# Patient Record
Sex: Female | Born: 1955 | Race: White | Hispanic: No | Marital: Married | Smoking: Never smoker
Health system: Southern US, Community
[De-identification: ages and names within clinical notes are randomized; demographics above are authoritative.]

## PROBLEM LIST (undated history)

## (undated) DIAGNOSIS — R7303 Prediabetes: Secondary | ICD-10-CM

## (undated) DIAGNOSIS — J45909 Unspecified asthma, uncomplicated: Secondary | ICD-10-CM

## (undated) DIAGNOSIS — E119 Type 2 diabetes mellitus without complications: Secondary | ICD-10-CM

## (undated) DIAGNOSIS — F419 Anxiety disorder, unspecified: Secondary | ICD-10-CM

## (undated) DIAGNOSIS — E039 Hypothyroidism, unspecified: Secondary | ICD-10-CM

## (undated) DIAGNOSIS — J189 Pneumonia, unspecified organism: Secondary | ICD-10-CM

## (undated) HISTORY — DX: Type 2 diabetes mellitus without complications: E11.9

## (undated) HISTORY — DX: Hypothyroidism, unspecified: E03.9

## (undated) HISTORY — DX: Unspecified asthma, uncomplicated: J45.909

## (undated) HISTORY — PX: KNEE ARTHROSCOPY: SUR90

---

## 1997-12-10 ENCOUNTER — Other Ambulatory Visit: Admission: RE | Admit: 1997-12-10 | Discharge: 1997-12-10 | Payer: Self-pay | Admitting: Obstetrics and Gynecology

## 2001-08-31 HISTORY — PX: MASS EXCISION: SHX2000

## 2001-09-10 ENCOUNTER — Encounter (INDEPENDENT_AMBULATORY_CARE_PROVIDER_SITE_OTHER): Payer: Self-pay | Admitting: Specialist

## 2001-09-10 ENCOUNTER — Ambulatory Visit (HOSPITAL_BASED_OUTPATIENT_CLINIC_OR_DEPARTMENT_OTHER): Admission: RE | Admit: 2001-09-10 | Discharge: 2001-09-10 | Payer: Self-pay | Admitting: Plastic Surgery

## 2001-12-30 ENCOUNTER — Other Ambulatory Visit: Admission: RE | Admit: 2001-12-30 | Discharge: 2001-12-30 | Payer: Self-pay | Admitting: Obstetrics and Gynecology

## 2003-08-18 ENCOUNTER — Other Ambulatory Visit: Admission: RE | Admit: 2003-08-18 | Discharge: 2003-08-18 | Payer: Self-pay | Admitting: Obstetrics and Gynecology

## 2004-09-05 ENCOUNTER — Other Ambulatory Visit: Admission: RE | Admit: 2004-09-05 | Discharge: 2004-09-05 | Payer: Self-pay | Admitting: Obstetrics and Gynecology

## 2007-05-31 ENCOUNTER — Encounter: Admission: RE | Admit: 2007-05-31 | Discharge: 2007-05-31 | Payer: Self-pay | Admitting: Obstetrics and Gynecology

## 2007-12-26 ENCOUNTER — Encounter: Admission: RE | Admit: 2007-12-26 | Discharge: 2007-12-26 | Payer: Self-pay | Admitting: Otolaryngology

## 2008-04-20 ENCOUNTER — Encounter: Admission: RE | Admit: 2008-04-20 | Discharge: 2008-04-20 | Payer: Self-pay | Admitting: Family Medicine

## 2008-10-05 ENCOUNTER — Emergency Department (HOSPITAL_COMMUNITY): Admission: EM | Admit: 2008-10-05 | Discharge: 2008-10-05 | Payer: Self-pay | Admitting: Emergency Medicine

## 2010-08-21 ENCOUNTER — Encounter: Payer: Self-pay | Admitting: Obstetrics and Gynecology

## 2010-08-21 ENCOUNTER — Encounter: Payer: Self-pay | Admitting: Family Medicine

## 2010-11-10 LAB — CBC
Hemoglobin: 14.8 g/dL (ref 12.0–15.0)
MCHC: 33.7 g/dL (ref 30.0–36.0)
MCV: 89 fL (ref 78.0–100.0)
RBC: 4.93 MIL/uL (ref 3.87–5.11)
RDW: 13 % (ref 11.5–15.5)

## 2010-11-10 LAB — BASIC METABOLIC PANEL
BUN: 10 mg/dL (ref 6–23)
Chloride: 107 mEq/L (ref 96–112)
Creatinine, Ser: 0.67 mg/dL (ref 0.4–1.2)
Potassium: 3.6 mEq/L (ref 3.5–5.1)
Sodium: 140 mEq/L (ref 135–145)

## 2010-11-10 LAB — DIFFERENTIAL
Basophils Relative: 1 % (ref 0–1)
Eosinophils Absolute: 0.3 10*3/uL (ref 0.0–0.7)
Lymphs Abs: 1.7 10*3/uL (ref 0.7–4.0)
Monocytes Absolute: 0.6 10*3/uL (ref 0.1–1.0)
Neutro Abs: 4.2 10*3/uL (ref 1.7–7.7)
Neutrophils Relative %: 61 % (ref 43–77)

## 2010-11-10 LAB — POCT CARDIAC MARKERS
CKMB, poc: <1 ng/mL — ABNORMAL LOW (ref 1.0–8.0)
Myoglobin, poc: 45 ng/mL (ref 12–200)
Myoglobin, poc: 62.3 ng/mL (ref 12–200)
Troponin i, poc: <0.05 ng/mL (ref 0.00–0.09)

## 2010-12-16 NOTE — Op Note (Signed)
[place]  Patient:    CRYSTEL, DEMARCO Visit Number: [id number] MRN: [id number]          Service Type: DSU Location: [facility] Attending Physician:  Eloise Levels Dictated by:   Mary A. Contogiannis, M.D. Proc. Date: 09/10/01 Admit Date:  09/10/2001 Discharge Date: 09/10/2001                             Operative Report  PREOPERATIVE DIAGNOSES: 1. Left forehead mass. 2. Right scalp suspicious skin lesion.  POSTOPERATIVE DIAGNOSES: 1. Left forehead mass. 2. Right scalp suspicious skin lesion.  PROCEDURES: 1. Excision of 2.3 cm left forehead mass. 2. Complex closure of 2.0 cm left forehead incision. 3. Excision of 1.1 cm right scalp mass. 4. Intermediate closure of 2.0 cm right scalp incision.  SURGEON:  Mary A. Contogiannis, M.D.  ANESTHESIA:  1% lidocaine with epinephrine.  COMPLICATIONS:  None.  INDICATION FOR PROCEDURE:  The patient is a 55 year old Caucasian female who has a left forehead mass.  This mass has been present almost since birth; however, lately it has been growing and becoming clinically symptomatic for the patient.  She also has a skin lesion in the right scalp that has been undergoing recent changes.  It is getting larger, becoming more raised, and changing color.  She requests that we proceed with excision of both the left forehead mass and the right scalp suspicious skin lesion.  DESCRIPTION OF PROCEDURE:  The patient was brought into the OR and placed on the table in supine position.  The left face was prepped with Betadine and draped in a sterile fashion.  The skin and subcutaneous tissues in the area of the mass were then injected with 1% lidocaine with epinephrine.  After adequate hemostasis and anesthesia had taken effect, the procedure was begun. An area of a left forehead wrinkle was marked directly over the skin mass. The incision was made in this wrinkle.  The incision was carried through the skin  into the subcutaneous tissues with a knife.   From this point, dissection was performed using the scissors.  It was evident that the mass was present below the frontalis muscle.  A small incision was made in the frontalis muscle to allow access to the mass.  The mass was cyst-like and was then sharply excised.  It was adherent to the periosteum.  The mass was then passed off the table to undergo permanent pathologic section evaluation.  The wound was then closed in complex fashion.  The frontalis muscle was closed with 4-0 Monocryl suture.  The deeper subcutaneous tissue and the dermal layer were closed both with 4-0 Monocryl suture as well.  The skin edges were then closed with 6-0 Prolene in a running baseball-type stitch.  The incision was dressed with bacitracin ointment and then a light pressure dressing.  Attention was then turned to the right scalp mass.  The area was prepped with Betadine and draped in sterile fashion.  The skin and subcutaneous tissues in the area of the skin lesion were injected with 1% lidocaine with epinephrine.  After adequate hemostasis and anesthesia had taken effect, the procedure was begun.  The skin lesion was excised with 1 mm of normal skin around it for margins.  The specimen was then marked at the 12 oclock position and passed off the table to undergo permanent pathologic section evaluation.  The skin edges were then undermined for closure.  The deeper subcutaneous tissues and galeal layers were closed with 4-0 Monocryl suture.  The dermal layer was then closed with 4-0 Monocryl suture.  The skin was then closed with a 4-0 Prolene running baseball-type stitch.  The incision was dressed with bacitracin ointment. There were no complications.  The patient tolerated the procedure well.  She was then ambulated and discharged home in stable condition.  The patient was given proper wound care instructions prior to discharge.  Follow-up appointment will be  tomorrow in the office. Dictated by:   Mary A. Contogiannis, M.D. Attending Physician:  Eloise Levels DD:  09/11/01 TD:  09/11/01 Job: [id number]

## 2012-02-21 ENCOUNTER — Encounter: Payer: Self-pay | Admitting: Gastroenterology

## 2012-03-14 ENCOUNTER — Encounter: Payer: Self-pay | Admitting: *Deleted

## 2012-03-21 ENCOUNTER — Encounter: Payer: Self-pay | Admitting: Gastroenterology

## 2012-03-21 ENCOUNTER — Ambulatory Visit (INDEPENDENT_AMBULATORY_CARE_PROVIDER_SITE_OTHER): Payer: BC Managed Care – PPO | Admitting: Gastroenterology

## 2012-03-21 VITALS — BP 100/60 | HR 76 | Ht 66.25 in | Wt 145.5 lb

## 2012-03-21 DIAGNOSIS — J45909 Unspecified asthma, uncomplicated: Secondary | ICD-10-CM

## 2012-03-21 DIAGNOSIS — Z8371 Family history of colonic polyps: Secondary | ICD-10-CM

## 2012-03-21 DIAGNOSIS — R1310 Dysphagia, unspecified: Secondary | ICD-10-CM

## 2012-03-21 DIAGNOSIS — R1314 Dysphagia, pharyngoesophageal phase: Secondary | ICD-10-CM

## 2012-03-21 NOTE — Progress Notes (Signed)
History of Present Illness:  This is a healthy 56 year old Caucasian female with progressive solid food dysphagia in her son xiphoid area and currently to the point where she has difficulty swallowing daily. She initially noticed some intermittent dysphagia in the late 1990s. She denies chronic reflux symptoms, problems swallowing liquids, or any history of severe allergies. The patient does complain of some postnasal drip. She is self-imposed a gluten-free diet because of abdominal bloating. Apparently there is a family history of colon polyps, but she has not had colonoscopy because of fears of hypoglycemia. There is no history of anorexia, weight loss, hepatobiliary or lower GI issues at this time. Apparently she is undergoing menopause but denies gynecologic problems chronically.  Patient does have a history of chronic asthma with intermittent inhaler use.   I have reviewed this patient's present history, medical and surgical past history, allergies and medications. ROS: The remainder of the 10 point ROS is negative     Physical Exam: Blood pressure 100/60, pulse 76 and regular, weight 145 pounds with a BMI of 23.31. General well developed well nourished patient in no acute distress, appearing their stated age Eyes PERRLA, no icterus, fundoscopic exam per opthamologist Skin no lesions noted Neck supple, no adenopathy, no thyroid enlargement, no tenderness Chest clear to percussion and auscultation Heart no significant murmurs, gallops or rubs noted Abdomen no hepatosplenomegaly masses or tenderness, BS normal.  Extremities no acute joint lesions, edema, phlebitis or evidence of cellulitis. Neurologic patient oriented x 3, cranial nerves intact, no focal neurologic deficits noted. Psychological mental status normal and normal affect.  Assessment and plan: Probable Schatzki's ring in the distal esophagus with associated solid food dysphagia. Other considerations would be eosinophilic  esophagitis or silent GERD. I've scheduled her for endoscopy and probable esophageal dilatation. We will obtain biopsies of her esophagus and also duodenal biopsies for celiac disease. We will need to work with this patient about scheduling screening colonoscopy in the future per age and family history of colon polyps.  No diagnosis found.

## 2012-03-21 NOTE — Patient Instructions (Addendum)
You have been scheduled for an endoscopy with propofol. Please follow written instructions given to you at your visit today. If you use inhalers (even only as needed), please bring them with you on the day of your procedure. CC: Dr. Elie Goody

## 2012-03-22 ENCOUNTER — Encounter: Payer: Self-pay | Admitting: Gastroenterology

## 2012-04-08 ENCOUNTER — Encounter: Payer: BC Managed Care – PPO | Admitting: Gastroenterology

## 2013-11-12 ENCOUNTER — Telehealth: Payer: Self-pay | Admitting: Internal Medicine

## 2013-11-12 ENCOUNTER — Ambulatory Visit: Payer: BC Managed Care – PPO | Admitting: Internal Medicine

## 2013-11-12 NOTE — Telephone Encounter (Signed)
No charge. 

## 2014-09-16 ENCOUNTER — Encounter (HOSPITAL_COMMUNITY): Payer: Self-pay | Admitting: Family Medicine

## 2014-09-16 ENCOUNTER — Emergency Department (HOSPITAL_COMMUNITY): Payer: BLUE CROSS/BLUE SHIELD

## 2014-09-16 ENCOUNTER — Observation Stay (HOSPITAL_COMMUNITY)
Admission: EM | Admit: 2014-09-16 | Discharge: 2014-09-17 | Disposition: A | Discharge status: Home / Self Care | Payer: BLUE CROSS/BLUE SHIELD | Attending: Internal Medicine | Admitting: Internal Medicine

## 2014-09-16 DIAGNOSIS — R079 Chest pain, unspecified: Secondary | ICD-10-CM | POA: Diagnosis not present

## 2014-09-16 DIAGNOSIS — Z79899 Other long term (current) drug therapy: Secondary | ICD-10-CM | POA: Insufficient documentation

## 2014-09-16 DIAGNOSIS — E119 Type 2 diabetes mellitus without complications: Secondary | ICD-10-CM | POA: Diagnosis not present

## 2014-09-16 DIAGNOSIS — J45909 Unspecified asthma, uncomplicated: Secondary | ICD-10-CM | POA: Diagnosis not present

## 2014-09-16 DIAGNOSIS — E039 Hypothyroidism, unspecified: Secondary | ICD-10-CM | POA: Insufficient documentation

## 2014-09-16 DIAGNOSIS — R072 Precordial pain: Secondary | ICD-10-CM

## 2014-09-16 DIAGNOSIS — Z8701 Personal history of pneumonia (recurrent): Secondary | ICD-10-CM | POA: Diagnosis not present

## 2014-09-16 DIAGNOSIS — E876 Hypokalemia: Secondary | ICD-10-CM | POA: Diagnosis not present

## 2014-09-16 HISTORY — DX: Pneumonia, unspecified organism: J18.9

## 2014-09-16 LAB — GLUCOSE, CAPILLARY
Glucose-Capillary: 92 mg/dL (ref 70–99)
Glucose-Capillary: 139 mg/dL — ABNORMAL HIGH (ref 70–99)

## 2014-09-16 LAB — I-STAT TROPONIN, ED: Troponin i, poc: 0 ng/mL (ref 0.00–0.08)

## 2014-09-16 LAB — BASIC METABOLIC PANEL
ANION GAP: 5 (ref 5–15)
ANION GAP: 8 (ref 5–15)
BUN: 13 mg/dL (ref 6–23)
BUN: 15 mg/dL (ref 6–23)
CALCIUM: 9.4 mg/dL (ref 8.4–10.5)
CHLORIDE: 112 mmol/L (ref 96–112)
CO2: 22 mmol/L (ref 19–32)
CO2: 21 mmol/L (ref 19–32)
Calcium: 9.1 mg/dL (ref 8.4–10.5)
Chloride: 108 mmol/L (ref 96–112)
Creatinine, Ser: 0.68 mg/dL (ref 0.50–1.10)
Creatinine, Ser: 0.66 mg/dL (ref 0.50–1.10)
GFR calc Af Amer: >90 mL/min (ref >90)
GFR calc Af Amer: >90 mL/min (ref >90)
GLUCOSE: 145 mg/dL — AB (ref 70–99)
Glucose, Bld: 93 mg/dL (ref 70–99)
Potassium: 4.2 mmol/L (ref 3.5–5.1)
Potassium: 3.3 mmol/L — ABNORMAL LOW (ref 3.5–5.1)
SODIUM: 137 mmol/L (ref 135–145)
Sodium: 139 mmol/L (ref 135–145)

## 2014-09-16 LAB — CBC
HCT: 39.2 % (ref 36.0–46.0)
Hemoglobin: 14.3 g/dL (ref 12.0–15.0)
MCH: 29.7 pg (ref 26.0–34.0)
MCHC: 36.5 g/dL — AB (ref 30.0–36.0)
MCV: 81.5 fL (ref 78.0–100.0)
Platelets: 266 10*3/uL (ref 150–400)
RBC: 4.81 MIL/uL (ref 3.87–5.11)
RDW: 13.3 % (ref 11.5–15.5)
WBC: 5.2 10*3/uL (ref 4.0–10.5)

## 2014-09-16 LAB — TSH: TSH: 1.186 u[IU]/mL (ref 0.350–4.500)

## 2014-09-16 LAB — CBG MONITORING, ED: GLUCOSE-CAPILLARY: 158 mg/dL — AB (ref 70–99)

## 2014-09-16 LAB — TROPONIN I
Troponin I: <0.03 ng/mL (ref <0.031)
Troponin I: <0.03 ng/mL (ref <0.031)

## 2014-09-16 MED ORDER — POTASSIUM CHLORIDE CRYS ER 20 MEQ PO TBCR
40.0000 meq | EXTENDED_RELEASE_TABLET | Freq: Once | ORAL | Status: AC
  Administered 2014-09-16: 40 meq via ORAL
  Filled 2014-09-16: qty 2

## 2014-09-16 MED ORDER — ACETAMINOPHEN 325 MG PO TABS: 650.0000 mg | ORAL_TABLET | ORAL | Status: DC | PRN

## 2014-09-16 MED ORDER — ENOXAPARIN SODIUM 40 MG/0.4ML ~~LOC~~ SOLN
40.0000 mg | SUBCUTANEOUS | Status: DC
  Filled 2014-09-16: qty 0.4

## 2014-09-16 MED ORDER — ASPIRIN 81 MG PO CHEW
81.0000 mg | CHEWABLE_TABLET | Freq: Once | ORAL | Status: AC
  Administered 2014-09-16: 81 mg via ORAL
  Filled 2014-09-16: qty 1

## 2014-09-16 MED ORDER — ALBUTEROL SULFATE (2.5 MG/3ML) 0.083% IN NEBU: 3.0000 mL | INHALATION_SOLUTION | Freq: Four times a day (QID) | RESPIRATORY_TRACT | Status: DC | PRN

## 2014-09-16 MED ORDER — THYROID 60 MG PO TABS
90.0000 mg | ORAL_TABLET | Freq: Every day | ORAL | Status: DC
  Filled 2014-09-16 (×2): qty 1

## 2014-09-16 MED ORDER — ONDANSETRON HCL 4 MG/2ML IJ SOLN
4.0000 mg | Freq: Four times a day (QID) | INTRAMUSCULAR | Status: DC | PRN
Start: 2014-09-16 — End: 2014-09-17

## 2014-09-16 MED ORDER — SODIUM CHLORIDE 0.9 % IV BOLUS (SEPSIS)
1000.0000 mL | Freq: Once | INTRAVENOUS | Status: AC
  Administered 2014-09-16: 1000 mL via INTRAVENOUS

## 2014-09-16 NOTE — H&P (Signed)
Date: 09/16/2014               Patient Name:  Kathy Griffin MRN: [id number]  DOB: 1956-01-28 Age / Sex: 59 y.o., female   PCP: Carroll Kinds, NP         Medical Service: Internal Medicine Teaching Service         Attending Physician: Dr. Madilyn Fireman, MD    First Contact: Dr. Ethelene Hal Pager: [phone]  Second Contact: Dr. Redmond Pulling Pager: [phone]       After Hours (After 5p/  First Contact Pager: [phone]  weekends / holidays): Second Contact Pager: [phone]   Chief Complaint: chest pain  History of Present Illness: 59yo F w/ PMH DM2 and hypothyroidism present with an episode of diaphoresis and chest pain while driving.  Pt was driving when she began to experience slow onset of sharp, substernal non-radiating chest pressure with numbness in bilateral hands and around her lips with lightheadedness but no vision changes. She then began to experience diaphoresis with heart palpitations and SOB. This resolved after about 15-20 minutes. She attributes this to stress and said she has had several previous episodes in the past when stressed but without the sequelae. 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 She has never had this before with exertion. She said EMS was called and provided the pt with 325mg  ASA. She did have a similar episode 2 years ago and was found to be hypoglycemic. Pt denies tobacco or h/o drug abuse; she is a social drinker. She has no personal h/o cardiac disease, but does have a family h/o cardiac disease in her father who had a CABG in his 106s. She has been told she has borderline DM2 managed with diet. She was unsure if she was willing to be admitted as she wants an out-patient stress but decided with her husband to stay.  Meds: No current facility-administered medications for this encounter.   Current Outpatient Prescriptions  Medication Sig Dispense Refill  . Ascorbic Acid (VITAMIN C) 1000 MG tablet Take 1,000 mg by mouth daily.      Marland Kitchen b complex vitamins capsule Take 1 capsule by mouth daily.    . Multiple Vitamin (MULTIVITAMIN) capsule Take 1 capsule by mouth daily.    . Omega-3 Fatty Acids (FISH OIL) 1000 MG CAPS Take 1 capsule by mouth daily.    Marland Kitchen thyroid (ARMOUR) 90 MG tablet Take 90 mg by mouth daily.    Marland Kitchen albuterol (PROVENTIL HFA;VENTOLIN HFA) 108 (90 BASE) MCG/ACT inhaler Inhale 2 puffs into the lungs every 6 (six) hours as needed for wheezing or shortness of breath.       Allergies: Allergies as of 09/16/2014  . (No Known Allergies)   Past Medical History  Diagnosis Date  . Asthma   . Hypothyroidism   . DM (diabetes mellitus)   . History of pneumonia    Past Surgical History  Procedure Laterality Date  . Excision of left  forehead mass  08-2001  . Excision right scalp mass  08-2001  . Knee arthroscopy      left   Family History  Problem Relation Age of Onset  . Colon polyps Father   . Heart disease Father   . Diabetes Mother     hypoglycemia   History   Social History  . Marital Status: Married    Spouse Name: N/A  . Number of Children: 4  . Years of Education: N/A   Occupational History  .  homemaker    Social History Main Topics  . Smoking status: Never Smoker   . Smokeless tobacco: Never Used  . Alcohol Use: Yes     Comment: 3-4 per week  . Drug Use: No  . Sexual Activity: Not on file   Other Topics Concern  . Not on file   Social History Narrative    Review of Systems: A comprehensive review of systems was negative except for: as noted above per HPI  Physical Exam: Blood pressure 111/79, pulse 68, temperature 98.3 F (36.8 C), temperature source Oral, resp. rate 19, height 5\' 7"  (1.702 m), weight 137 lb (62.143 kg), last menstrual period 09/16/2013, SpO2 100 %.  Gen: A&O x 4, no acute distress, well developed, well nourished HEENT: Atraumatic, PERRL, EOMI, sclerae anicteric, moist mucous membranes Neck: No carotid bruits or JVD Heart: Regular rate and rhythm, normal S1  S2, no murmurs, rubs, or gallops Lungs: Clear to auscultation bilaterally, respirations unlabored Abd: Soft, non-tender, non-distended, + bowel sounds, no hepatosplenomegaly Ext: No edema or cyanosis  Lab results: Basic Metabolic Panel:  Recent Labs  09/16/14 1125  NA 137  K 3.3*  CL 108  CO2 21  GLUCOSE 145*  BUN 15  CREATININE 0.66  CALCIUM 9.4   CBC:  Recent Labs  09/16/14 1125  WBC 5.2  HGB 14.3  HCT 39.2  MCV 81.5  PLT 266   Cardiac Enzymes: 09/16/14 i-stat troponin 0.00  CBG:  Recent Labs  09/16/14 1111  GLUCAP 158*   Hemoglobin A1C: Pending  Fasting Lipid Panel: Pending  Thyroid Function Tests: Pending  Imaging results:  Dg Chest 2 View  09/16/2014   CLINICAL DATA:  Chest pain and dizziness  EXAM: CHEST  2 VIEW  COMPARISON:  None.  FINDINGS: The heart size and mediastinal contours are within normal limits. Both lungs are clear. The visualized skeletal structures are unremarkable.  IMPRESSION: No active cardiopulmonary disease.   Electronically Signed   By: Inez Catalina M.D.   On: 09/16/2014 13:14    Other results: EKG: NSR, no t-wave inversions or ST elevation or depression, RSR' in V2 but no prior  Assessment & Plan by Problem: 59yo F w/ PMH DM2 and hypothyroidism present with an episode of diaphoresis and chest pain while driving.  #Chest pain: Pt with acute onset sharp, substernal chest pain that began while driving today. Also with diaphoresis, palpitations, and SOB. Similar previous episodes when stressed as she was now as well as  2 years ago and was found to be hypoglycemic. CBG 185 in the ED. Pt with h/o hypothyroidism and is on thyroid supplementation; possible sx related to thyroid dysfunction. No recent TSH. EKG unremarkable except for RSR' in V2 without prior, troponin negative x1. TIMI score 0 which is a 5 percent risk at 14 days of all cause mortality, new or recurrent MI, or severe recurrent ischemia requiring urgent  revascularization. However with her h/o diabetes and given her age, cannot r/o ACS. t - Admit to IMTS to obs tele - cards consult, consider out-patient stress if test negative - Troponin q6 x3 - EKG in AM - Checking TSH - Checking A1c, lipid panel - ASA 81 mg daily - cardiac monitoring - continuous pulse ox  #Diabetes mellitus: No hemoglobin A1c in system. She says it is diet controlled. CBG in ED 185. -check hemoglobin A1c -hold on SSI pending CBG check  #Hypothyroidism: No TSH on file. She takes thyroid armour 90 mg daily -check TSH -cont thyroid armour  90 mg daily  #Hypokalemia: K 3.3 on admission.  - Replacing with 19mEq x1. - BMP in AM  #Diet: heart  #DVT PPx: lovenox 40 mg [place] daily  #Code: full  Dispo: Disposition is deferred at this time, awaiting improvement of current medical problems. Anticipated discharge in approximately 1-2 day(s).   The patient does have a current PCP (Carroll Kinds, NP) and does not need an [facility] hospital follow-up appointment after discharge.  The patient does not have transportation limitations that hinder transportation to clinic appointments.  Signed: [name], MD 09/16/2014, 3:57 PM

## 2014-09-16 NOTE — ED Notes (Signed)
[place] PA aware that patient is declining CXR at this time.

## 2014-09-16 NOTE — ED Notes (Signed)
Pt presents via [facility] with c/o chest pressure that began at 1005 while driving. PT reports her hands also cramped up and felt "numb" and she became flush/pale. Pt given 325mg  ASA PTA.  Pt is pain free at this time.

## 2014-09-16 NOTE — Consult Note (Signed)
CARDIOLOGY CONSULT NOTE  Referring Physician: Ethelene Hal Primary Physician: Primary Cardiologist: None Reason for Consultation:    HPI: 59yo F w/ PMH DM2 and hypothyroidism whom we are asked to see for CP.   She denies any h/o known heart disease. Very active at baseline without anginal symptoms. Several years ago had episode of CP which was attributed to hypoglycemia.  Has been under a lot of stress lately. Over past couple weeks has had a few episodes of chest pain. Says one was severe and felt like someone was pressing on her chest. She describes others as just "pangs." Never has CP when she is exercising.   Today she was driving when she began to experience slow onset of sharp, substernal non-radiating chest pressure with numbness in bilateral hands and around her lips with lightheadedness but no vision changes. She then began to experience diaphoresis with heart palpitations and SOB. This resolved after about 15-20 minutes. She said EMS was called and provided the pt with 325mg  ASA.   She was unsure if she was willing to be admitted as she wants an out-patient stress but decided with her husband to stay. ECG and troponin are normal.    Review of Systems:     Cardiac Review of Systems: {Y] = yes [ ]  = no  Chest Pain [   y ]  Resting SOB [  y ] Exertional SOB  [  ]  Orthopnea [  ]   Pedal Edema [   ]    Palpitations Blue.Reese  ] Syncope  [  ]   Presyncope [   ]  General Review of Systems: [Y] = yes [  ]=no Constitional: recent weight change [  ]; anorexia [  ]; fatigue [  ]; nausea [  ]; night sweats [  ]; fever [  ]; or chills [  ];                 Eyes : blurred vision [  ]; diplopia [   ]; vision changes [  ];  Amaurosis fugax[  ]; Resp: cough [  ];  wheezing[  ];  hemoptysis[  ];  PND [  ];  GI:  gallstones[  ], vomiting[  ];  dysphagia[  ]; melena[  ];  hematochezia [  ]; heartburn[  ];   GU: kidney stones [  ]; hematuria[  ];   dysuria [  ];  nocturia[  ]; incontinence [  ];   Skin: rash, swelling[  ];, hair loss[  ];  peripheral edema[  ];  or itching[  ]; Musculosketetal: myalgias[  ];  joint swelling[  ];  joint erythema[  ];  joint pain[  ];  back pain[  ];  Heme/Lymph: bruising[  ];  bleeding[  ];  anemia[  ];  Neuro: TIA[  ];  headaches[  ];  stroke[  ];  vertigo[  ];  seizures[  ];   paresthesias[  ];  difficulty walking[  ];  Psych:depression[  ]; anxiety[y ];  Endocrine: diabetes[  y;  thyroid dysfunction[  ];  Other:  Past Medical History  Diagnosis Date  . Asthma   . Hypothyroidism   . DM (diabetes mellitus)   . History of pneumonia     Medications Prior to Admission  Medication Sig Dispense Refill  . Ascorbic Acid (VITAMIN C) 1000 MG tablet Take 1,000 mg by mouth daily.    Marland Kitchen b complex vitamins capsule Take 1 capsule by mouth daily.    Marland Kitchen  Multiple Vitamin (MULTIVITAMIN) capsule Take 1 capsule by mouth daily.    . Omega-3 Fatty Acids (FISH OIL) 1000 MG CAPS Take 1 capsule by mouth daily.    Marland Kitchen thyroid (ARMOUR) 90 MG tablet Take 90 mg by mouth daily.    Marland Kitchen albuterol (PROVENTIL HFA;VENTOLIN HFA) 108 (90 BASE) MCG/ACT inhaler Inhale 2 puffs into the lungs every 6 (six) hours as needed for wheezing or shortness of breath.        Marland Kitchen aspirin  81 mg Oral Once  . enoxaparin (LOVENOX) injection  40 mg Subcutaneous Q24H  . potassium chloride  40 mEq Oral Once  . [START ON 09/17/2014] thyroid  90 mg Oral Daily    Infusions:    No Known Allergies  History   Social History  . Marital Status: Married    Spouse Name: N/A  . Number of Children: 4  . Years of Education: N/A   Occupational History  . homemaker    Social History Main Topics  . Smoking status: Never Smoker   . Smokeless tobacco: Never Used  . Alcohol Use: Yes     Comment: 3-4 per week  . Drug Use: No  . Sexual Activity: Not on file   Other Topics Concern  . Not on file   Social History Narrative    Family History  Problem Relation Age of Onset  . Colon polyps Father   .  Heart disease Father   . Diabetes Mother     hypoglycemia    PHYSICAL EXAM: Filed Vitals:   09/16/14 1735  BP: 132/75  Pulse: 58  Temp: 98.1 F (36.7 C)  Resp: 17    No intake or output data in the 24 hours ending 09/16/14 1825  General:  Well appearing. No respiratory difficulty HEENT: normal Neck: supple. no JVD. Carotids 2+ bilat; no bruits. No lymphadenopathy or thryomegaly appreciated. Cor: PMI nondisplaced. Regular rate & rhythm. No rubs, gallops or murmurs. Lungs: clear Abdomen: soft, nontender, nondistended. No hepatosplenomegaly. No bruits or masses. Good bowel sounds. Extremities: no cyanosis, clubbing, rash, edema Neuro: alert & oriented x 3, cranial nerves grossly intact. moves all 4 extremities w/o difficulty. Affect pleasant.  ECG: NSR 69 RSR' No ST-T wave abnormalities.    Results for orders placed or performed during the hospital encounter of 09/16/14 (from the past 24 hour(s))  CBG monitoring, ED     Status: Abnormal   Collection Time: 09/16/14 11:11 AM  Result Value Ref Range   Glucose-Capillary 158 (H) 70 - 99 mg/dL  CBC     Status: Abnormal   Collection Time: 09/16/14 11:25 AM  Result Value Ref Range   WBC 5.2 4.0 - 10.5 K/uL   RBC 4.81 3.87 - 5.11 MIL/uL   Hemoglobin 14.3 12.0 - 15.0 g/dL   HCT 39.2 36.0 - 46.0 %   MCV 81.5 78.0 - 100.0 fL   MCH 29.7 26.0 - 34.0 pg   MCHC 36.5 (H) 30.0 - 36.0 g/dL   RDW 13.3 11.5 - 15.5 %   Platelets 266 150 - 400 K/uL  Basic metabolic panel     Status: Abnormal   Collection Time: 09/16/14 11:25 AM  Result Value Ref Range   Sodium 137 135 - 145 mmol/L   Potassium 3.3 (L) 3.5 - 5.1 mmol/L   Chloride 108 96 - 112 mmol/L   CO2 21 19 - 32 mmol/L   Glucose, Bld 145 (H) 70 - 99 mg/dL   BUN 15 6 - 23 mg/dL  Creatinine, Ser 0.66 0.50 - 1.10 mg/dL   Calcium 9.4 8.4 - 10.5 mg/dL   GFR calc non Af Amer >90 >90 mL/min   GFR calc Af Amer >90 >90 mL/min   Anion gap 8 5 - 15  I-stat troponin, ED (not at [facility])      Status: None   Collection Time: 09/16/14 11:44 AM  Result Value Ref Range   Troponin i, poc 0.00 0.00 - 0.08 ng/mL   Comment 3          Glucose, capillary     Status: None   Collection Time: 09/16/14  5:46 PM  Result Value Ref Range   Glucose-Capillary 92 70 - 99 mg/dL   Comment 1 Documented in Char    Comment 2 Repeat Test    Dg Chest 2 View  09/16/2014   CLINICAL DATA:  Chest pain and dizziness  EXAM: CHEST  2 VIEW  COMPARISON:  None.  FINDINGS: The heart size and mediastinal contours are within normal limits. Both lungs are clear. The visualized skeletal structures are unremarkable.  IMPRESSION: No active cardiopulmonary disease.   Electronically Signed   By: Inez Catalina M.D.   On: 09/16/2014 13:14     ASSESSMENT: 1. Chest pain, atypical 2. DM2   PLAN/DISCUSSION:  Suspect CP is related to anxiety. Given DM2 and FHx will proceed with exercise myoview in am. Can have very small snack prior to exercise given episodes of hypoglycemia in past.  Benay Spice 6:52 PM

## 2014-09-16 NOTE — ED Provider Notes (Signed)
CSN: [id number]     Arrival date & time 09/16/14  1041 History   First MD Initiated Contact with Patient 09/16/14 1047     Chief Complaint  Patient presents with  . Chest Pain     (Consider location/radiation/quality/duration/timing/severity/associated sxs/prior Treatment) HPI    59 year old female with history of non-insulin-dependent dependent diabetes, asthma, hypothyroidism who presents for evaluation of chest pain. Patient was brought here via EMS from her car. Patient reports while driving today she developed gradual onset of left-sided chest pain which she described as sharp pressure sensation with associated numbness sensation to both of her hands and around her lips. Patient states she felt lightheadedness without any significant vision changes. She became pale, diaphoretic. She also endorsed some heart palpitation and some mild shortness of breath as well. Report intermittent heart palpitation in the past, and having shortness of breath but unsure if this is related to her asthma. When EMS arrive patient did receive 325 mg of aspirin. At this time she is symptom-free. Patient denies having any fever, double vision, severe headache, trouble with his speech, abnormal coordination, neck stiffness, abdominal pain, back pain, or weakness. She is nonsmoker, and is a social drinker. Does report that her father has history of heart disease. No prior history of PE or stroke. No recent cardiac stress test. Patient reported having one similar incident 2 years ago when she was admitted to the hospital and found to be hypoglycemic. She was concerning for hypoglycemia.  Past Medical History  Diagnosis Date  . Asthma   . Hypothyroidism   . DM (diabetes mellitus)   . History of pneumonia    Past Surgical History  Procedure Laterality Date  . Excision of left  forehead mass  08-2001  . Excision right scalp mass  08-2001  . Knee arthroscopy      left   Family History  Problem Relation Age of  Onset  . Colon polyps Father   . Heart disease Father   . Diabetes Mother     hypoglycemia   History  Substance Use Topics  . Smoking status: Never Smoker   . Smokeless tobacco: Never Used  . Alcohol Use: Yes     Comment: 3-4 per week   OB History    No data available     Review of Systems  All other systems reviewed and are negative.     Allergies  Review of patient's allergies indicates no known allergies.  Home Medications   Prior to Admission medications   Medication Sig Start Date End Date Taking? Authorizing Provider  Ascorbic Acid (VITAMIN C) 1000 MG tablet Take 1,000 mg by mouth daily.   Yes Historical Provider, MD  b complex vitamins capsule Take 1 capsule by mouth daily.   Yes Historical Provider, MD  Multiple Vitamin (MULTIVITAMIN) capsule Take 1 capsule by mouth daily.   Yes Historical Provider, MD  Omega-3 Fatty Acids (FISH OIL) 1000 MG CAPS Take 1 capsule by mouth daily.   Yes Historical Provider, MD  thyroid (ARMOUR) 90 MG tablet Take 90 mg by mouth daily.   Yes Historical Provider, MD  albuterol (PROVENTIL HFA;VENTOLIN HFA) 108 (90 BASE) MCG/ACT inhaler Inhale 2 puffs into the lungs every 6 (six) hours as needed for wheezing or shortness of breath.     Historical Provider, MD   BP 129/75 mmHg  Pulse 65  Temp(Src) 98.1 F (36.7 C) (Oral)  Resp 16  Ht 5\' 7"  (1.702 m)  Wt 137 lb (62.143 kg)  BMI 21.45 kg/m2  SpO2 100%  LMP 09/16/2013 (Approximate) Physical Exam  Constitutional: She is oriented to person, place, and time. She appears well-developed and well-nourished. No distress.  HENT:  Head: Atraumatic.  Mouth/Throat: Oropharynx is clear and moist.  Eyes: Conjunctivae and EOM are normal. Pupils are equal, round, and reactive to light.  Neck: Normal range of motion. Neck supple.  Cardiovascular: Normal rate, regular rhythm and intact distal pulses.   Pulmonary/Chest: Effort normal and breath sounds normal.  Abdominal: Soft. There is no tenderness.   Musculoskeletal: She exhibits no edema.  Neurological: She is alert and oriented to person, place, and time.  Neurologic exam:  Speech clear, pupils equal round reactive to light, extraocular movements intact  Normal peripheral visual fields Cranial nerves III through XII normal including no facial droop Follows commands, moves all extremities x4, normal strength to bilateral upper and lower extremities at all major muscle groups including grip Sensation normal to light touch and pinprick Coordination intact, no limb ataxia, finger-nose-finger normal Rapid alternating movements normal No pronator drift Gait normal   Skin: No rash noted.  Psychiatric: She has a normal mood and affect.  Nursing note and vitals reviewed.   ED Course  Procedures (including critical care time)  12:14 PM  patient here with transient chest discomfort and lightheadedness. Her heart scores 4. She is currently symptom free. She has no focal neuro deficit concerning for stroke. The symptom is not consistence with TIA. She is not hypoglycemic.  1:56 PM Patient is currently symptom free. Workup reassuring, given her risk factors, and no prior cardiac provocative testing patient will be admitted for a cardiac rule out. I have consult with internal medicine resident who agrees to see and admit patient. Care discussed with Dr. Leonides Schanz. Patient agrees with plan.   Labs Review Labs Reviewed  CBC - Abnormal; Notable for the following:    MCHC 36.5 (*)    All other components within normal limits  BASIC METABOLIC PANEL - Abnormal; Notable for the following:    Potassium 3.3 (*)    Glucose, Bld 145 (*)    All other components within normal limits  CBG MONITORING, ED - Abnormal; Notable for the following:    Glucose-Capillary 158 (*)    All other components within normal limits  I-STAT TROPOININ, ED    Imaging Review Dg Chest 2 View  09/16/2014   CLINICAL DATA:  Chest pain and dizziness  EXAM: CHEST  2 VIEW   COMPARISON:  None.  FINDINGS: The heart size and mediastinal contours are within normal limits. Both lungs are clear. The visualized skeletal structures are unremarkable.  IMPRESSION: No active cardiopulmonary disease.   Electronically Signed   By: Inez Catalina M.D.   On: 09/16/2014 13:14     EKG Interpretation   Date/Time:  Wednesday September 16 2014 10:53:20 EST Ventricular Rate:  69 PR Interval:  138 QRS Duration: 94 QT Interval:  412 QTC Calculation: 441 R Axis:   69 Text Interpretation:  Sinus rhythm Probable left atrial enlargement RSR'  in V1 or V2, right VCD or RVH Confirmed by WARD,  DO, KRISTEN ([id number]) on  09/16/2014 11:32:46 AM      MDM   Final diagnoses:  Chest pain, unspecified chest pain type    BP 129/66 mmHg  Pulse 61  Temp(Src) 98.2 F (36.8 C) (Oral)  Resp 17  Ht 5\' 7"  (1.702 m)  Wt 137 lb (62.143 kg)  BMI 21.45 kg/m2  SpO2 100%  LMP 09/16/2013 (  Approximate)  I have reviewed nursing notes and vital signs. I personally reviewed the imaging tests through PACS system  I reviewed available ER/hospitalization records thought the EMR     Domenic Moras, PA-C 09/16/14 [place], DO 09/16/14 1512

## 2014-09-17 ENCOUNTER — Encounter (HOSPITAL_COMMUNITY): Payer: Self-pay | Admitting: General Practice

## 2014-09-17 ENCOUNTER — Other Ambulatory Visit: Payer: Self-pay | Admitting: Cardiology

## 2014-09-17 DIAGNOSIS — R079 Chest pain, unspecified: Secondary | ICD-10-CM

## 2014-09-17 DIAGNOSIS — E039 Hypothyroidism, unspecified: Secondary | ICD-10-CM | POA: Diagnosis not present

## 2014-09-17 DIAGNOSIS — E119 Type 2 diabetes mellitus without complications: Secondary | ICD-10-CM | POA: Diagnosis not present

## 2014-09-17 DIAGNOSIS — J45909 Unspecified asthma, uncomplicated: Secondary | ICD-10-CM | POA: Diagnosis not present

## 2014-09-17 DIAGNOSIS — E1159 Type 2 diabetes mellitus with other circulatory complications: Secondary | ICD-10-CM

## 2014-09-17 LAB — TROPONIN I: Troponin I: <0.03 ng/mL (ref <0.031)

## 2014-09-17 LAB — BASIC METABOLIC PANEL
Anion gap: 4 — ABNORMAL LOW (ref 5–15)
BUN: 9 mg/dL (ref 6–23)
CHLORIDE: 107 mmol/L (ref 96–112)
CO2: 29 mmol/L (ref 19–32)
CREATININE: 0.67 mg/dL (ref 0.50–1.10)
Calcium: 9.3 mg/dL (ref 8.4–10.5)
GFR calc Af Amer: >90 mL/min (ref >90)
GFR calc non Af Amer: >90 mL/min (ref >90)
GLUCOSE: 105 mg/dL — AB (ref 70–99)
Potassium: 4 mmol/L (ref 3.5–5.1)
Sodium: 140 mmol/L (ref 135–145)

## 2014-09-17 LAB — LIPID PANEL
Cholesterol: 190 mg/dL (ref 0–200)
HDL: 58 mg/dL (ref >39)
LDL CALC: 117 mg/dL — AB (ref 0–99)
TRIGLYCERIDES: 73 mg/dL (ref <150)
Total CHOL/HDL Ratio: 3.3 RATIO
VLDL: 15 mg/dL (ref 0–40)

## 2014-09-17 LAB — GLUCOSE, CAPILLARY
GLUCOSE-CAPILLARY: 100 mg/dL — AB (ref 70–99)
Glucose-Capillary: 119 mg/dL — ABNORMAL HIGH (ref 70–99)

## 2014-09-17 MED ORDER — POTASSIUM CHLORIDE CRYS ER 20 MEQ PO TBCR: 40.0000 meq | EXTENDED_RELEASE_TABLET | Freq: Once | ORAL | Status: DC

## 2014-09-17 NOTE — Discharge Summary (Signed)
Name: Kathy Griffin MRN: [id number] DOB: 1956-02-19 59 y.o. PCP: Kathy Kinds, NP  Date of Admission: 09/16/2014 10:41 AM Date of Discharge: 09/17/2014 Attending Physician: No att. providers found  Discharge Diagnosis:  Principal Problem:   Chest pain Active Problems:   Diabetes mellitus   Hypothyroidism  Discharge Medications:   Medication List    TAKE these medications        albuterol 108 (90 BASE) MCG/ACT inhaler  Commonly known as:  PROVENTIL HFA;VENTOLIN HFA  Inhale 2 puffs into the lungs every 6 (six) hours as needed for wheezing or shortness of breath.     b complex vitamins capsule  Take 1 capsule by mouth daily.     Fish Oil 1000 MG Caps  Take 1 capsule by mouth daily.     multivitamin capsule  Take 1 capsule by mouth daily.     thyroid 90 MG tablet  Commonly known as:  ARMOUR  Take 90 mg by mouth daily.     vitamin C 1000 MG tablet  Take 1,000 mg by mouth daily.        Disposition and follow-up:   Ms.Kathy Griffin was discharged from [facility] in Good condition.  At the hospital follow up visit please address:  1.  Possible stress test, any recurrent chest pain, consider statin  2.  Labs / imaging needed at time of follow-up: consider stress test, repeat BMP (low K on presentation)  3.  Pending labs/ test needing follow-up: hemoglobin A1c  Follow-up Appointments: Follow-up Information    Follow up with Nahser, Wonda Cheng, MD On 10/14/2014.   Specialty:  Cardiology   Why:  @ 8:30 am   Contact information:   [address] [place] [address] [phone]       Follow up with Kathy Carl, MD On 09/21/2014.   Specialty:  Pain Medicine   Why:  @ 3 pm   Contact information:   [place] [address] [phone]       Follow up with Nahser, Wonda Cheng, MD On 09/24/2014.   Specialty:  Cardiology   Why:  at 9:45AM  for exercise [facility] information:   [address] [place] [address] [phone]       Discharge Instructions: Discharge Instructions    Diet - low sodium heart healthy    Complete by:  As directed      Increase activity slowly    Complete by:  As directed            Consultations: Treatment Team:  Rounding Lbcardiology, MD  Procedures Performed:  Dg Chest 2 View  09/16/2014   CLINICAL DATA:  Chest pain and dizziness  EXAM: CHEST  2 VIEW  COMPARISON:  None.  FINDINGS: The heart size and mediastinal contours are within normal limits. Both lungs are clear. The visualized skeletal structures are unremarkable.  IMPRESSION: No active cardiopulmonary disease.   Electronically Signed   By: Kathy Griffin M.D.   On: 09/16/2014 13:14   EKG: NSR, no t-wave inversions or ST elevation or depression, RSR' in V2 but no prior  Admission HPI: 59yo F w/ PMH DM2 and hypothyroidism present with an episode of diaphoresis and chest pain while driving.  Pt was driving when she began to experience slow onset of sharp, substernal non-radiating chest pressure with numbness in bilateral hands and around her lips with lightheadedness but no  vision changes. She then began to experience diaphoresis with heart palpitations and SOB. This resolved after about 15-20 minutes. She attributes this to stress and said she has had several previous episodes in the past when stressed but without the sequelae. 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 She has never had this before with exertion. She said EMS was called and provided the pt with 325mg  ASA. She did have a similar episode 2 years ago and was found to be hypoglycemic. Pt denies tobacco or h/o drug abuse; she is a social drinker. She has no personal h/o cardiac disease, but does have a family h/o cardiac disease in her father who had a CABG in his 32s. She has been told she has borderline DM2 managed with diet. She was unsure if she was willing to be admitted as she  wants an out-patient stress but decided with her husband to stay.  Hospital Course by problem list: Principal Problem:   Chest pain Active Problems:   Diabetes mellitus   Hypothyroidism   #Chest pain: Pt with acute onset sharp, substernal chest pain that began while driving 0/25. Also with diaphoresis, palpitations, and SOB. Similar previous episodes when stressed as she was now as well as 2 years ago and was found to be hypoglycemic. Sugars have been normal. EKG unremarkable other than RSR' in V2 without prior and troponin negative x 3. TIMI score 0 which is a 5 percent risk at 14 days of all cause mortality, new or recurrent MI, or severe recurrent ischemia requiring urgent revascularization. TSH wnl. She was seen by cardiology but refused exercise myoview the morning of discharge as she is not concerned this is cardiac but rather stress related and she is concerned about radiation exposure. She said she would consider cardiology follow-up with out-patient stress testing. This may be stress related as she says she gets this when she is stressed. She refused statin and this should be discussed with PCP.  #Diabetes mellitus: No hemoglobin A1c in system. She says it is diet controlled. CBG 119 in am. PCP should follow-up hemoglobin A1c.  #Hypothyroidism: No TSH on file but checked and now 1.186. She takes thyroid armour 90 mg daily which was continued.  #Hypokalemia: K 3.3 on admission. She received KDur 40 mEq and up to 4.0 the morning of discharge.   Discharge Vitals:   BP 101/61 mmHg  Pulse 64  Temp(Src) 98 F (36.7 C) (Oral)  Resp 18  Ht 5\' 7"  (1.702 m)  Wt 140 lb 11.2 oz (63.821 kg)  BMI 22.03 kg/m2  SpO2 98%  LMP 09/16/2013 (Approximate)  Discharge Physical Exam: Gen: A&O x 4, no acute distress, well developed, well nourished HEENT: Atraumatic, PERRL, EOMI, sclerae anicteric, moist mucous membranes Heart: Regular rate and rhythm, normal S1 S2, no murmurs, rubs, or  gallops Lungs: Clear to auscultation bilaterally, respirations unlabored Abd: Soft, non-tender, non-distended, + bowel sounds, no hepatosplenomegaly Ext: No edema or cyanosis  Discharge Labs:  Basic Metabolic Panel:  Recent Labs  09/16/14 1125 09/17/14 0910  NA 137 140  K 3.3* 4.0  CL 108 107  CO2 21 29  GLUCOSE 145* 105*  BUN 15 9  CREATININE 0.66 0.67  CALCIUM 9.4 9.3   CBC:  Recent Labs  09/16/14 1125  WBC 5.2  HGB 14.3  HCT 39.2  MCV 81.5  PLT 266   Cardiac Enzymes:  Recent Labs  09/16/14 2000 09/16/14 2044 09/16/14 2317  TROPONINI <0.03 <0.03 <0.03  CBG:  Recent Labs  09/16/14 1111 09/16/14 1746 09/16/14 2036 09/17/14 0746 09/17/14 1200  GLUCAP 158* 92 139* 119* 100*   Fasting Lipid Panel:  Recent Labs  09/17/14 0910  CHOL 190  HDL 58  LDLCALC 117*  TRIG 73  CHOLHDL 3.3   Thyroid Function Tests:  Recent Labs  09/16/14 2000  TSH 1.186   Signed: [name], MD 09/17/2014, 2:13 PM    Services Ordered on Discharge: none Equipment Ordered on Discharge: none

## 2014-09-17 NOTE — Progress Notes (Signed)
         Subjective: No further pain  Objective: Vital signs in last 24 hours: Temp:  [98 F (36.7 C)-98.5 F (36.9 C)] 98 F (36.7 C) (02/18 0500) Pulse Rate:  [56-71] 64 (02/18 0500) Resp:  [12-23] 18 (02/18 0500) BP: (101-139)/(48-81) 101/61 mmHg (02/18 0500) SpO2:  [98 %-100 %] 98 % (02/18 0500) Weight:  [137 lb (62.143 kg)-140 lb 11.2 oz (63.821 kg)] 140 lb 11.2 oz (63.821 kg) (02/17 1735) Weight change:  Last BM Date: 09/16/14 Intake/Output from previous day:not done   Intake/Output this shift:    PE: General:Pleasant affect, NAD Skin:Warm and dry, brisk capillary refill HEENT:normocephalic, sclera clear, mucus membranes moist Heart:S1S2 RRR without murmur, gallup, rub or click Lungs:clear without rales, rhonchi, or wheezes FHQ:RFXJ, non tender, + BS, do not palpate liver spleen or masses Ext:no lower ext edema, 2+ pedal pulses, 2+ radial pulses Neuro:alert and oriented, MAE, follows commands, + facial symmetry  Tele:  SR no ectopy  Lab Results:  Recent Labs  09/16/14 1125  WBC 5.2  HGB 14.3  HCT 39.2  PLT 266   BMET  Recent Labs  09/16/14 0500 09/16/14 1125  NA 139 137  K 4.2 3.3*  CL 112 108  CO2 22 21  GLUCOSE 93 145*  BUN 13 15  CREATININE 0.68 0.66  CALCIUM 9.1 9.4    Recent Labs  09/16/14 2044 09/16/14 2317  TROPONINI <0.03 <0.03    No results found for: CHOL, HDL, LDLCALC, LDLDIRECT, TRIG, CHOLHDL No results found for: HGBA1C   Lab Results  Component Value Date   TSH 1.186 09/16/2014      Studies/Results: Dg Chest 2 View  09/16/2014   CLINICAL DATA:  Chest pain and dizziness  EXAM: CHEST  2 VIEW  COMPARISON:  None.  FINDINGS: The heart size and mediastinal contours are within normal limits. Both lungs are clear. The visualized skeletal structures are unremarkable.  IMPRESSION: No active cardiopulmonary disease.   Electronically Signed   By: Inez Catalina M.D.   On: 09/16/2014 13:14    Medications: I have reviewed the  patient's current medications. Scheduled Meds: . enoxaparin (LOVENOX) injection  40 mg Subcutaneous Q24H  . thyroid  90 mg Oral Daily   Continuous Infusions:  PRN Meds:.acetaminophen, albuterol, ondansetron (ZOFRAN) IV  Assessment/Plan: Principal Problem:   Chest pain- neg troponin, pt has eaten and does not want stress myoview  test, she never has pain with exertion.  She has no further pain.  She believes her symptoms were related to hypoglycemia and stress, she has been under a lot of stress. ? Discharge and outpt regular stress test- she did not isotope.  Dr. Tamala Julian to see.  Active Problems:   Diabetes mellitus- with episodes of hypoglycemia    Hypothyroidism      Time spent with pt. :15 minutes. [facility] R  Nurse Practitioner Certified Pager [phone] or after 5pm and on weekends call [phone] 09/17/2014, 9:52 AM

## 2014-09-17 NOTE — Progress Notes (Signed)
Subjective: Kathy Griffin. She has not had the chest pain since the episode that brought her to the hospital. She was seen by cardiology who wanted exercise myoview this morning. When she went down she refused the test. I went to speak with her and she said she is not concerned that this is her heart. She was adamant she would not undergo the test as she was concerned about radiation exposure. She was agreeable to me scheduling out-patient follow-up with cardiology and she would consider stress test in the interim  Objective: Vital signs in last 24 hours: Filed Vitals:   09/16/14 1601 09/16/14 1735 09/16/14 2032 09/17/14 0500  BP: 124/72 132/75 111/71 101/61  Pulse:  58 71 64  Temp: 98 F (36.7 C) 98.1 F (36.7 C) 98.5 F (36.9 C) 98 F (36.7 C)  TempSrc: Oral Oral Oral   Resp: 20 17 18 18   Height:  5\' 7"  (1.702 m)    Weight:  140 lb 11.2 oz (63.821 kg)    SpO2: 100% 100% 98% 98%   Weight change:  No intake or output data in the 24 hours ending 09/17/14 1043  Gen: A&O x 4, no acute distress, well developed, well nourished HEENT: Atraumatic, PERRL, EOMI, sclerae anicteric, moist mucous membranes Heart: Regular rate and rhythm, normal S1 S2, no murmurs, rubs, or gallops Lungs: Clear to auscultation bilaterally, respirations unlabored Abd: Soft, non-tender, non-distended, + bowel sounds, no hepatosplenomegaly Ext: No edema or cyanosis  Lab Results: Basic Metabolic Panel:  Recent Labs Lab 09/16/14 1125 09/17/14 0910  NA 137 140  K 3.3* 4.0  CL 108 107  CO2 21 29  GLUCOSE 145* 105*  BUN 15 9  CREATININE 0.66 0.67  CALCIUM 9.4 9.3   CBC:  Recent Labs Lab 09/16/14 1125  WBC 5.2  HGB 14.3  HCT 39.2  MCV 81.5  PLT 266   Cardiac Enzymes:  Recent Labs Lab 09/16/14 2000 09/16/14 2044 09/16/14 2317  TROPONINI <0.03 <0.03 <0.03   CBG:  Recent Labs Lab 09/16/14 1111 09/16/14 1746 09/16/14 2036 09/17/14 0746  GLUCAP 158* 92 139* 119*   Thyroid Function  Tests:  Recent Labs Lab 09/16/14 2000  TSH 1.186   Micro Results: No results found for this or any previous visit (from the past 240 hour(s)). Studies/Results: Dg Chest 2 View  09/16/2014   CLINICAL DATA:  Chest pain and dizziness  EXAM: CHEST  2 VIEW  COMPARISON:  None.  FINDINGS: The heart size and mediastinal contours are within normal limits. Both lungs are clear. The visualized skeletal structures are unremarkable.  IMPRESSION: No active cardiopulmonary disease.   Electronically Signed   By: Inez Catalina M.D.   On: 09/16/2014 13:14   Medications: I have reviewed the patient's current medications. Scheduled Meds: . enoxaparin (LOVENOX) injection  40 mg Subcutaneous Q24H  . thyroid  90 mg Oral Daily   Continuous Infusions:  PRN Meds:.acetaminophen, albuterol, ondansetron (ZOFRAN) IV Assessment/Plan: Principal Problem:   Chest pain Active Problems:   Diabetes mellitus   Hypothyroidism  #Chest pain: Pt with acute onset sharp, substernal chest pain that began while driving today. Also with diaphoresis, palpitations, and SOB. Similar previous episodes when stressed as she was now as well as 2 years ago and was found to be hypoglycemic. Sugars have been normal. EKG unremarkable other than RSR' in V2 without prior and troponin negative x 3. TIMI score 0 which is a 5 percent risk at 14 days of all cause mortality, new or recurrent  MI, or severe recurrent ischemia requiring urgent revascularization. TSH wnl. She was seen by cardiology but refused exercise myoview this morning as she is not concerned this is cardiac but rather stress related and she is concerned about radiation exposure. She said she would consider cardiology follow-up. This may be stress related as she says she gets this when she is stressed. - appreciate cards -outpatient cards f/u as she refuses recommended exercise myoview - cards consult, consider out-patient stress if test negative - f/u hemoglobin A1c - ASA 81 mg  daily - PCP consider statin as she says she would not want one now - cardiac monitoring - continuous pulse ox  #Diabetes mellitus: No hemoglobin A1c in system. She says it is diet controlled. CBG 119 in am. -f/u hemoglobin A1c -hold on SSI   #Hypothyroidism: No TSH on file but checked and now 1.186. She takes thyroid armour 90 mg daily -cont thyroid armour 90 mg daily  #Hypokalemia: K 3.3 on admission. She received KDur 40 mEq and up to 4.0 this morning.  -repeat as out-patient  Dispo: Disposition is deferred at this time, awaiting improvement of current medical problems.  Anticipated discharge in approximately today.   The patient does have a current PCP (Carroll Kinds, NP) and does need an [facility] hospital follow-up appointment after discharge.  The patient does not have transportation limitations that hinder transportation to clinic appointments.  .Services Needed at time of discharge: Y = Yes, Blank = No PT:   OT:   RN:   Equipment:   Other:       Kathy Aline, MD 09/17/2014, 10:43 AM

## 2014-09-17 NOTE — Discharge Instructions (Signed)
It was a pleasure to care for you at [facility]. We have not started any new medicines. Please go to your follow-up appointments with your primary care provider and the cardiologist.Please return to the Emergency Department or seek medical attention if you have any new or worsening chest pain, trouble breathing, or other worrisome medical condition.   Lottie Mussel, MD   Please be nothing to eat or drink 4 hours prior to test.  Have a light breakfast by 5:30   Wear exercise clothes and tennis shoes.  The test is at Dr. Elmarie Shiley office.  If any questions please call.

## 2014-09-17 NOTE — Progress Notes (Signed)
UR completed 

## 2014-09-18 LAB — HEMOGLOBIN A1C
Hgb A1c MFr Bld: 5.7 % — ABNORMAL HIGH (ref 4.8–5.6)
Mean Plasma Glucose: 117 mg/dL

## 2014-09-24 ENCOUNTER — Encounter (HOSPITAL_COMMUNITY): Payer: BLUE CROSS/BLUE SHIELD

## 2014-10-01 ENCOUNTER — Encounter (HOSPITAL_COMMUNITY): Payer: BLUE CROSS/BLUE SHIELD

## 2014-10-12 ENCOUNTER — Telehealth: Payer: Self-pay | Admitting: Cardiovascular Disease

## 2014-10-13 NOTE — Telephone Encounter (Signed)
noted 

## 2014-10-14 ENCOUNTER — Encounter: Payer: BLUE CROSS/BLUE SHIELD | Admitting: Cardiovascular Disease

## 2015-10-12 ENCOUNTER — Encounter (INDEPENDENT_AMBULATORY_CARE_PROVIDER_SITE_OTHER): Payer: Self-pay

## 2015-10-12 ENCOUNTER — Ambulatory Visit (INDEPENDENT_AMBULATORY_CARE_PROVIDER_SITE_OTHER): Payer: BLUE CROSS/BLUE SHIELD | Admitting: Sports Medicine

## 2015-10-12 ENCOUNTER — Encounter: Payer: Self-pay | Admitting: Sports Medicine

## 2015-10-12 VITALS — BP 118/68 | Ht 66.0 in | Wt 145.0 lb

## 2015-10-12 DIAGNOSIS — M1712 Unilateral primary osteoarthritis, left knee: Secondary | ICD-10-CM | POA: Diagnosis not present

## 2015-10-12 NOTE — Progress Notes (Signed)
Patient ID: Kathy Griffin, female   DOB: Sep 29, 1955, 60 y.o.   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 Past - did tennis until 15 yrs ago and gave that up with knee pain/ ant Walks for exercise - and takes care of horses - usually about 1 mile Days of week with activity are 7  Biking Used do to [facility] and road End of May in 2016 left knee swelled after a ride  2 wks ago felt unstable/ swelled and pain all around to bike Wanted to buckle She decided to come for eval  Past history Patellar dislocation on the left requiring arthroscopic surgery Increase mobility of certain joints   X-ray at flexogenic showed some mild arthritis Cortisone shot did not make any difference  Soc Hx Non smoker 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 Small SPP effusion Palpation normal with no warmth or joint line tenderness or patellar tenderness or condyle tenderness. ROM normal in flexion and extension and lower leg rotation. However left knee shows less flexion than RT Ligaments with solid consistent endpoints including ACL, PCL, LCL, MCL. Negative Mcmurray's and provocative meniscal tests. Some crepitation on PF movement Patellar and quadriceps tendons unremarkable. Hamstring  strength is normal.  Quadriceps is a bit weaker on the left with poor VMO development Left hip abduction is weak  Beighton score is 3  Ultrasound Bilateral knees show patella  Alta Left knee shows fragmentation and avulsions at the distal patella insertion to the patellar tendon There is a small suprapatellar pouch effusion only under the vastus lateralis There is some mild spurring along the joint line Some mild degenerative meniscal changes Soft tissue calcifications in the suprapatellar area and. Peri-patellar area

## 2015-10-12 NOTE — Assessment & Plan Note (Signed)
Based on review of her chart I do not think she has a firm diagnosis of diabetes but relatively high levels of glucose at 105 and a glycohemoglobin of 5.7  I suspect she would be an excellent responder to conservative care I suggested entering the osteoarthritis study Focus on home exercise program as directed by physical therapists  Reassess after 3 months to see if this is giving her improvement

## 2016-01-07 ENCOUNTER — Other Ambulatory Visit: Payer: Self-pay | Admitting: Pain Medicine

## 2016-01-07 DIAGNOSIS — N6310 Unspecified lump in the right breast, unspecified quadrant: Secondary | ICD-10-CM

## 2016-01-11 ENCOUNTER — Other Ambulatory Visit: Payer: Self-pay | Admitting: Pain Medicine

## 2016-01-11 DIAGNOSIS — N6310 Unspecified lump in the right breast, unspecified quadrant: Secondary | ICD-10-CM

## 2016-01-18 ENCOUNTER — Other Ambulatory Visit: Payer: BLUE CROSS/BLUE SHIELD

## 2017-02-07 ENCOUNTER — Other Ambulatory Visit: Payer: Self-pay | Admitting: Family Medicine

## 2017-02-07 DIAGNOSIS — N6310 Unspecified lump in the right breast, unspecified quadrant: Secondary | ICD-10-CM

## 2017-02-19 ENCOUNTER — Other Ambulatory Visit: Payer: BLUE CROSS/BLUE SHIELD

## 2018-06-30 DIAGNOSIS — C50919 Malignant neoplasm of unspecified site of unspecified female breast: Secondary | ICD-10-CM

## 2018-06-30 DIAGNOSIS — C801 Malignant (primary) neoplasm, unspecified: Secondary | ICD-10-CM

## 2018-06-30 HISTORY — DX: Malignant (primary) neoplasm, unspecified: C80.1

## 2018-06-30 HISTORY — PX: PORTACATH PLACEMENT: SHX2246

## 2018-06-30 HISTORY — DX: Malignant neoplasm of unspecified site of unspecified female breast: C50.919

## 2018-07-08 ENCOUNTER — Other Ambulatory Visit: Payer: Self-pay | Admitting: Radiology

## 2018-07-09 ENCOUNTER — Telehealth: Payer: Self-pay | Admitting: Hematology and Oncology

## 2018-07-09 NOTE — Telephone Encounter (Signed)
Patient called back to confirm afternoon [facility] appointment

## 2018-07-11 ENCOUNTER — Encounter: Payer: Self-pay | Admitting: *Deleted

## 2018-07-15 ENCOUNTER — Encounter: Payer: Self-pay | Admitting: Licensed Clinical Social Worker

## 2018-07-16 ENCOUNTER — Encounter: Payer: Self-pay | Admitting: *Deleted

## 2018-07-16 ENCOUNTER — Other Ambulatory Visit: Payer: Self-pay | Admitting: *Deleted

## 2018-07-16 DIAGNOSIS — C50412 Malignant neoplasm of upper-outer quadrant of left female breast: Secondary | ICD-10-CM

## 2018-07-16 DIAGNOSIS — Z171 Estrogen receptor negative status [ER-]: Secondary | ICD-10-CM

## 2018-07-16 NOTE — Progress Notes (Signed)
[facility] CONSULT NOTE  Patient Care Team: Carroll Kinds, NP (Inactive) as PCP - General (Nurse Practitioner) Rolm Bookbinder, MD as Consulting Physician (General Surgery) Nicholas Lose, MD as Consulting Physician (Hematology and Oncology) Eppie Gibson, MD as Attending Physician (Radiation Oncology)  CHIEF COMPLAINTS/PURPOSE OF CONSULTATION:  Newly diagnosed breast cancer  HISTORY OF PRESENTING ILLNESS:  Kathy Griffin 62 y.o. female is here because of recent diagnosis of triple negative invasive ductal carcinoma of the left breast. The cancer was palpable to the patient in early October prior to diagnosis. A diagnostic mammogram on 07/02/18 showed a 1.5cm mass at the 11 o'clock position in the left breast. A breast biopsy on 07/08/18 showed an abnormally enlarged lymph node in the left axilla suggestive of metastatic malignancy. The biopsy showed the cancer to be grade 3, Ki67 60%, ER/PR negative, HER2 negative.   She presents to the clinic today with her husband. She has a family history of breast cancer-- her mother was diagnosed with stage 0 breast cancer at age 5. Her husband noted she is taking progesterone and using estradiol creme and expressed worry about her supplements and herbal remedies interacting with treatment. She notes she has a holistic medicine doctor who provides her supplements and she will see him in the near future. She has another appointment to get a second opinion tomorrow. She expressed interest in participating in a cardiac clinical trial.   I reviewed her records extensively and collaborated the history with the patient.  REVIEW OF SYSTEMS:   Constitutional: Denies fevers, chills or abnormal night sweats Eyes: Denies blurriness of vision, double vision or watery eyes Ears, nose, mouth, throat, and face: Denies mucositis or sore throat Respiratory: Denies cough, dyspnea or wheezes Cardiovascular: Denies palpitation, chest discomfort or lower  extremity swelling Gastrointestinal:  Denies nausea, heartburn or change in bowel habits Skin: Denies abnormal skin rashes Lymphatics: Denies new lymphadenopathy or easy bruising Neurological:Denies numbness, tingling or new weaknesses Behavioral/Psych: Mood is stable, no new changes  Breast: Denies any palpable lumps or discharge All other systems were reviewed with the patient and are negative.  SUMMARY OF ONCOLOGIC HISTORY:   Malignant neoplasm of upper-outer quadrant of left breast in female, estrogen receptor negative ([place])   07/16/2018 Initial Diagnosis    Screening detected left breast cancer, 2.1 cm at 12 o'clock position 3 cm from the nipple, 2.8 cm abnormal lymph node, biopsy revealed grade 3 IDC with DCIS high-grade, triple negative, Ki-67 60%, T2N1 stage IIIb clinical stage    07/17/2018 Cancer Staging    Staging form: Breast, AJCC 8th Edition - Clinical: Stage IIIB (cT2, cN1, cM0, G3, ER-, PR-, HER2-) - Signed by Nicholas Lose, MD on 07/17/2018    07/30/2018 -  Chemotherapy    The patient had DOXOrubicin (ADRIAMYCIN) chemo injection 100 mg, 60 mg/m2 = 100 mg, Intravenous,  Once, 0 of 4 cycles palonosetron (ALOXI) injection 0.25 mg, 0.25 mg, Intravenous,  Once, 0 of 8 cycles pegfilgrastim-cbqv (UDENYCA) injection 6 mg, 6 mg, Subcutaneous, Once, 0 of 4 cycles CARBOplatin (PARAPLATIN) in sodium chloride 0.9 % 100 mL chemo infusion, , Intravenous,  Once, 0 of 4 cycles cyclophosphamide (CYTOXAN) 1,000 mg in sodium chloride 0.9 % 250 mL chemo infusion, 600 mg/m2 = 1,000 mg, Intravenous,  Once, 0 of 4 cycles PACLitaxel (TAXOL) 132 mg in sodium chloride 0.9 % 250 mL chemo infusion (</= 26m/m2), 80 mg/m2, Intravenous,  Once, 0 of 4 cycles fosaprepitant (EMEND) 150 mg, dexamethasone (DECADRON) 12 mg in sodium chloride  0.9 % 145 mL IVPB, , Intravenous,  Once, 0 of 8 cycles  for chemotherapy treatment.       MEDICAL HISTORY:  Past Medical History:  Diagnosis Date  . Asthma   .  DM (diabetes mellitus) ([place])   . Hypothyroidism   . Pneumonia     SURGICAL HISTORY: Past Surgical History:  Procedure Laterality Date  . KNEE ARTHROSCOPY Left   . MASS EXCISION Left 08/2001   forehead  . MASS EXCISION Right 08/2001   scalp    SOCIAL HISTORY: Social History   Socioeconomic History  . Marital status: Married    Spouse name: Not on file  . Number of children: 4  . Years of education: Not on file  . Highest education level: Not on file  Occupational History  . Occupation: homemaker  Social Needs  . Financial resource strain: Not on file  . Food insecurity:    Worry: Not on file    Inability: Not on file  . Transportation needs:    Medical: Not on file    Non-medical: Not on file  Tobacco Use  . Smoking status: Never Smoker  . Smokeless tobacco: Never Used  Substance and Sexual Activity  . Alcohol use: Yes    Comment: 3-4 per week  . Drug use: No  . Sexual activity: Not on file  Lifestyle  . Physical activity:    Days per week: Not on file    Minutes per session: Not on file  . Stress: Not on file  Relationships  . Social connections:    Talks on phone: Not on file    Gets together: Not on file    Attends religious service: Not on file    Active member of club or organization: Not on file    Attends meetings of clubs or organizations: Not on file    Relationship status: Not on file  . Intimate partner violence:    Fear of current or ex partner: Not on file    Emotionally abused: Not on file    Physically abused: Not on file    Forced sexual activity: Not on file  Other Topics Concern  . Not on file  Social History Narrative  . Not on file    FAMILY HISTORY: Family History  Problem Relation Age of Onset  . Colon polyps Father   . Heart disease Father   . Diabetes Mother        hypoglycemia    ALLERGIES:  has No Known Allergies.  MEDICATIONS:  Current Outpatient Medications  Medication Sig Dispense Refill  . albuterol (PROVENTIL  HFA;VENTOLIN HFA) 108 (90 BASE) MCG/ACT inhaler Inhale 2 puffs into the lungs every 6 (six) hours as needed for wheezing or shortness of breath.     . Ascorbic Acid (VITAMIN C) 1000 MG tablet Take 1,000 mg by mouth daily.    Marland Kitchen b complex vitamins capsule Take 1 capsule by mouth daily.    Marland Kitchen dexamethasone (DECADRON) 4 MG tablet Take 1 tablet day after chemotherapy and 1 tablet 2 days after chemotherapy with food 8 tablet 0  . fluconazole (DIFLUCAN) 200 MG tablet TAKE 1 TABLET BY MOUTH 2 TIMES WEEKLY  3  . lidocaine-prilocaine (EMLA) cream Apply to affected area once 30 g 3  . LORazepam (ATIVAN) 0.5 MG tablet Take 1 tablet (0.5 mg total) by mouth at bedtime as needed for sleep. 30 tablet 0  . minocycline (MINOCIN,DYNACIN) 100 MG capsule TAKE 2 CAPSULES BY MOUTH TWICE A  DAY EVERY OTHER DAY 1HR BEFORE TINDAMAX  2  . Multiple Vitamin (MULTIVITAMIN) capsule Take 1 capsule by mouth daily.    . Omega-3 Fatty Acids (FISH OIL) 1000 MG CAPS Take 1 capsule by mouth daily.    . ondansetron (ZOFRAN) 8 MG tablet Take 1 tablet (8 mg total) by mouth 2 (two) times daily as needed. Start on the third day after chemotherapy. 30 tablet 1  . prochlorperazine (COMPAZINE) 10 MG tablet Take 1 tablet (10 mg total) by mouth every 6 (six) hours as needed (Nausea or vomiting). 30 tablet 1  . thyroid (ARMOUR) 90 MG tablet Take 90 mg by mouth daily.    Marland Kitchen tinidazole (TINDAMAX) 500 MG tablet TAKE 2 TABLETS BY MOUTH TWICE A DAY EVERY OTHER DAY AFTER MONOCYCLINE  2   No current facility-administered medications for this visit.    PHYSICAL EXAMINATION: ECOG PERFORMANCE STATUS: 0 - Asymptomatic  Vitals:   07/17/18 1320  BP: 122/73  Pulse: 71  Resp: 18  Temp: 98.2 F (36.8 C)  SpO2: 98%   Filed Weights   07/17/18 1320  Weight: 133 lb 6.4 oz (60.5 kg)    GENERAL:alert, no distress and comfortable SKIN: skin color, texture, turgor are normal, no rashes or significant lesions EYES: normal, conjunctiva are pink and  non-injected, sclera clear OROPHARYNX:no exudate, no erythema and lips, buccal mucosa, and tongue normal  NECK: supple, thyroid normal size, non-tender, without nodularity LYMPH:  no palpable lymphadenopathy in the cervical, axillary or inguinal LUNGS: clear to auscultation and percussion with normal breathing effort HEART: regular rate & rhythm and no murmurs and no lower extremity edema ABDOMEN:abdomen soft, non-tender and normal bowel sounds Musculoskeletal:no cyanosis of digits and no clubbing  PSYCH: alert & oriented x 3 with fluent speech NEURO: no focal motor/sensory deficits BREAST: Palpable lump in the left breast and palpable lymph node in the left axilla. No palpable axillary or supraclavicular lymphadenopathy (exam performed in the presence of a chaperone)   LABORATORY DATA:  I have reviewed the data as listed Lab Results  Component Value Date   WBC 5.9 07/17/2018   HGB 14.8 07/17/2018   HCT 44.5 07/17/2018   MCV 87.3 07/17/2018   PLT 236 07/17/2018   Lab Results  Component Value Date   NA 142 07/17/2018   K 4.6 07/17/2018   CL 105 07/17/2018   CO2 29 07/17/2018    RADIOGRAPHIC STUDIES: I have personally reviewed the radiological reports and agreed with the findings in the report.  ASSESSMENT AND PLAN:  Malignant neoplasm of upper-outer quadrant of left breast in female, estrogen receptor negative ([place]) Screening detected left breast cancer, 2.1 cm at 12 o'clock position 3 cm from the nipple, 2.8 cm abnormal lymph node, biopsy revealed grade 3 IDC with DCIS high-grade, triple negative, Ki-67 60%, T2N1 stage IIIb clinical stage  Pathology and radiology counseling: Discussed with the patient, the details of pathology including the type of breast cancer,the clinical staging, the significance of ER, PR and HER-2/neu receptors and the implications for treatment. After reviewing the pathology in detail, we proceeded to discuss the different treatment options between  surgery, radiation, chemotherapy, antiestrogen therapies.  Recommendation: 1.  Neoadjuvant chemotherapy with dose dense Adriamycin and Cytoxan x4 followed by Taxol and carboplatin x12 2.  Breast conserving surgery with targeted lymph node biopsy if possible 3.  Adjuvant radiation  Chemotherapy Counseling: I discussed the risks and benefits of chemotherapy including the risks of nausea/ vomiting, risk of infection from low WBC count,  fatigue due to chemo or anemia, bruising or bleeding due to low platelets, mouth sores, loss/ change in taste and decreased appetite. Liver and kidney function will be monitored through out chemotherapy as abnormalities in liver and kidney function may be a side effect of treatment. Cardiac dysfunction due to Adriamycin was discussed in detail. Risk of permanent bone marrow dysfunction and leukemia due to chemo were also discussed.  Plan: 1.  Breast MRI 2.  Port placement 3.  Genetics consultation 4.  Echocardiogram 5.  Chemo class 6.  Upbeat clinical trial  Return to clinic to begin chemotherapy    All questions were answered. The patient knows to call the clinic with any problems, questions or concerns.    Nicholas Lose, MD 07/17/2018   I, Cloyde Reams Dorshimer, am acting as scribe for Nicholas Lose, MD.  I have reviewed the above documentation for accuracy and completeness, and I agree with the above.

## 2018-07-17 ENCOUNTER — Inpatient Hospital Stay: Payer: 59

## 2018-07-17 ENCOUNTER — Ambulatory Visit: Payer: 59 | Attending: General Surgery | Admitting: Physical Therapy

## 2018-07-17 ENCOUNTER — Encounter: Payer: Self-pay | Admitting: *Deleted

## 2018-07-17 ENCOUNTER — Encounter: Payer: Self-pay | Admitting: Radiation Oncology

## 2018-07-17 ENCOUNTER — Inpatient Hospital Stay (HOSPITAL_BASED_OUTPATIENT_CLINIC_OR_DEPARTMENT_OTHER): Payer: 59 | Admitting: Hematology and Oncology

## 2018-07-17 ENCOUNTER — Telehealth: Payer: Self-pay | Admitting: Hematology and Oncology

## 2018-07-17 ENCOUNTER — Other Ambulatory Visit: Payer: Self-pay | Admitting: General Surgery

## 2018-07-17 ENCOUNTER — Encounter: Payer: Self-pay | Admitting: Physical Therapy

## 2018-07-17 ENCOUNTER — Other Ambulatory Visit: Payer: Self-pay

## 2018-07-17 ENCOUNTER — Ambulatory Visit
Admission: RE | Admit: 2018-07-17 | Discharge: 2018-07-17 | Disposition: A | Discharge status: Home / Self Care | Payer: 59 | Source: Ambulatory Visit | Attending: Radiation Oncology | Admitting: Radiation Oncology

## 2018-07-17 VITALS — BP 122/73 | HR 71 | Temp 98.2°F | Resp 18 | Ht 66.0 in | Wt 133.4 lb

## 2018-07-17 DIAGNOSIS — C50412 Malignant neoplasm of upper-outer quadrant of left female breast: Secondary | ICD-10-CM

## 2018-07-17 DIAGNOSIS — Z171 Estrogen receptor negative status [ER-]: Principal | ICD-10-CM

## 2018-07-17 DIAGNOSIS — Z170 Estrogen receptor positive status [ER+]: Secondary | ICD-10-CM | POA: Insufficient documentation

## 2018-07-17 DIAGNOSIS — Z79899 Other long term (current) drug therapy: Secondary | ICD-10-CM

## 2018-07-17 DIAGNOSIS — R293 Abnormal posture: Secondary | ICD-10-CM | POA: Insufficient documentation

## 2018-07-17 DIAGNOSIS — Z923 Personal history of irradiation: Secondary | ICD-10-CM

## 2018-07-17 DIAGNOSIS — E039 Hypothyroidism, unspecified: Secondary | ICD-10-CM

## 2018-07-17 DIAGNOSIS — J45909 Unspecified asthma, uncomplicated: Secondary | ICD-10-CM

## 2018-07-17 DIAGNOSIS — E119 Type 2 diabetes mellitus without complications: Secondary | ICD-10-CM | POA: Diagnosis not present

## 2018-07-17 LAB — CMP (CANCER CENTER ONLY)
ALT: 23 U/L (ref 0–44)
ANION GAP: 8 (ref 5–15)
AST: 25 U/L (ref 15–41)
Albumin: 4.4 g/dL (ref 3.5–5.0)
Alkaline Phosphatase: 111 U/L (ref 38–126)
BUN: 11 mg/dL (ref 6–20)
CO2: 29 mmol/L (ref 22–32)
Calcium: 10.2 mg/dL (ref 8.9–10.3)
Chloride: 105 mmol/L (ref 98–111)
Creatinine: 0.86 mg/dL (ref 0.44–1.00)
GFR, Est AFR Am: >60 mL/min (ref >60)
GFR, Estimated: >60 mL/min (ref >60)
GLUCOSE: 118 mg/dL — AB (ref 70–99)
POTASSIUM: 4.6 mmol/L (ref 3.5–5.1)
Sodium: 142 mmol/L (ref 135–145)
Total Bilirubin: 0.4 mg/dL (ref 0.3–1.2)
Total Protein: 7.3 g/dL (ref 6.5–8.1)

## 2018-07-17 LAB — CBC WITH DIFFERENTIAL (CANCER CENTER ONLY)
ABS IMMATURE GRANULOCYTES: 0.01 10*3/uL (ref 0.00–0.07)
Basophils Absolute: 0.1 10*3/uL (ref 0.0–0.1)
Basophils Relative: 1 %
Eosinophils Absolute: 0.2 10*3/uL (ref 0.0–0.5)
Eosinophils Relative: 3 %
HCT: 44.5 % (ref 36.0–46.0)
Hemoglobin: 14.8 g/dL (ref 12.0–15.0)
Immature Granulocytes: 0 %
LYMPHS ABS: 1.5 10*3/uL (ref 0.7–4.0)
LYMPHS PCT: 25 %
MCH: 29 pg (ref 26.0–34.0)
MCHC: 33.3 g/dL (ref 30.0–36.0)
MCV: 87.3 fL (ref 80.0–100.0)
MONOS PCT: 10 %
Monocytes Absolute: 0.6 10*3/uL (ref 0.1–1.0)
NEUTROS ABS: 3.6 10*3/uL (ref 1.7–7.7)
Neutrophils Relative %: 61 %
Platelet Count: 236 10*3/uL (ref 150–400)
RBC: 5.1 MIL/uL (ref 3.87–5.11)
RDW: 13 % (ref 11.5–15.5)
WBC Count: 5.9 10*3/uL (ref 4.0–10.5)
nRBC: 0 % (ref 0.0–0.2)

## 2018-07-17 MED ORDER — LORAZEPAM 0.5 MG PO TABS: 0.5000 mg | ORAL_TABLET | Freq: Every evening | ORAL | 0 refills | Status: AC | PRN

## 2018-07-17 MED ORDER — PROCHLORPERAZINE MALEATE 10 MG PO TABS: 10.0000 mg | ORAL_TABLET | Freq: Four times a day (QID) | ORAL | 1 refills | Status: AC | PRN

## 2018-07-17 MED ORDER — ONDANSETRON HCL 8 MG PO TABS: 8.0000 mg | ORAL_TABLET | Freq: Two times a day (BID) | ORAL | 1 refills | Status: DC | PRN

## 2018-07-17 MED ORDER — LIDOCAINE-PRILOCAINE 2.5-2.5 % EX CREA: TOPICAL_CREAM | CUTANEOUS | 3 refills | Status: AC

## 2018-07-17 MED ORDER — DEXAMETHASONE 4 MG PO TABS: ORAL_TABLET | ORAL | 0 refills | Status: AC

## 2018-07-17 NOTE — Therapy (Signed)
[place], [place], [address] Phone: [phone]   Fax:  [phone]  Physical Therapy Evaluation  Patient Details  Name: Kathy Griffin MRN: [id number] Date of Birth: 08/04/1955 Referring Provider (PT): Dr. Rolm Bookbinder   Encounter Date: 07/17/2018  PT End of Session - 07/17/18 1550    Visit Number  1    Number of Visits  1    PT Start Time  1356    PT Stop Time  1427    PT Time Calculation (min)  31 min    Activity Tolerance  Patient tolerated treatment well    Behavior During Therapy  [facility] for tasks assessed/performed       Past Medical History:  Diagnosis Date  . Asthma   . DM (diabetes mellitus) ([place])   . Hypothyroidism   . Pneumonia     Past Surgical History:  Procedure Laterality Date  . KNEE ARTHROSCOPY Left   . MASS EXCISION Left 08/2001   forehead  . MASS EXCISION Right 08/2001   scalp    There were no vitals filed for this visit.   Subjective Assessment - 07/17/18 1540    Subjective  Patient reports she is here today to be seen by her medical team for her newly diagnosed left breast cancer.    Patient is accompained by:  Family member    Pertinent History  Patient was diagnosed on 07/02/18 with left grade III triple negative invasive ductal carcinoma breast cancer. It measures 2.1 cm and is located in the upper outer quadrant. There is a known iopsied positive axillary lymph node and the Ki67 is 60%. She is otherwise healthy.    Patient Stated Goals  Reduce lymphedema risk and learn post op ROM HEP    Currently in Pain?  No/denies         [facility] PT Assessment - 07/17/18 0001      Assessment   Medical Diagnosis  Left breast cancer    Referring Provider (PT)  Dr. Rolm Bookbinder    Onset Date/Surgical Date  07/02/18    Hand Dominance  Right    Prior Therapy  None      Precautions   Precautions  Other (comment)    Precaution Comments  active cancer      Restrictions   Weight Bearing Restrictions  No      Balance Screen   Has the patient fallen in the past 6 months  No    Has the patient had a decrease in activity level because of a fear of falling?   No    Is the patient reluctant to leave their home because of a fear of falling?   No      Home Environment   Living Environment  Private residence    Living Arrangements  Spouse/significant other;Children   Husband; 15 y.o. 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    Leisure  She walks 20 min/day      Cognition   Overall Cognitive Status  Within Functional Limits for tasks assessed      Posture/Postural Control   Posture/Postural Control  Postural limitations    Postural Limitations  Rounded Shoulders;Forward head      ROM / Strength  AROM / PROM / Strength  AROM;Strength      AROM   AROM Assessment Site  Shoulder;Cervical    Right/Left Shoulder  Right;Left    Right Shoulder Extension  39 Degrees    Right Shoulder Flexion  154 Degrees    Right Shoulder ABduction  173 Degrees    Right Shoulder Internal Rotation  51 Degrees    Right Shoulder External Rotation  84 Degrees    Left Shoulder Extension  39 Degrees    Left Shoulder Flexion  156 Degrees    Left Shoulder ABduction  170 Degrees    Left Shoulder Internal Rotation  76 Degrees    Left Shoulder External Rotation  90 Degrees    Cervical Flexion  WNL    Cervical Extension  WNL    Cervical - Right Side Bend  25% limited    Cervical - Left Side Bend  25% limited    Cervical - Right Rotation  WNL    Cervical - Left Rotation  WNL      Strength   Overall Strength  Within functional limits for tasks performed        LYMPHEDEMA/ONCOLOGY QUESTIONNAIRE - 07/17/18 1543      Type   Cancer Type  Left breast      Lymphedema Assessments    Lymphedema Assessments  Upper extremities      Right Upper Extremity Lymphedema   10 cm Proximal to Olecranon Process  25.8 cm    Olecranon Process  22.9 cm    10 cm Proximal to Ulnar Styloid Process  21.3 cm    Just Proximal to Ulnar Styloid Process  14.6 cm    Across Hand at [employer]  18.8 cm    At [place] of 2nd Digit  6 cm      Left Upper Extremity Lymphedema   10 cm Proximal to Olecranon Process  24.9 cm    Olecranon Process  22.4 cm    10 cm Proximal to Ulnar Styloid Process  19.8 cm    Just Proximal to Ulnar Styloid Process  14 cm    Across Hand at [employer]  17.5 cm    At [place] of 2nd Digit  5.8 cm             Objective measurements completed on examination: See above findings.       Patient was instructed today in a home exercise program today for post op shoulder range of motion. These included active assist shoulder flexion in sitting, scapular retraction, wall walking with shoulder abduction, and hands behind head external rotation.  She was encouraged to do these twice a day, holding 3 seconds and repeating 5 times when permitted by her physician.           PT Education - 07/17/18 1550    Education Details  Lymphedema risk reduction and post op shoulder ROM HEP    Person(s) Educated  Patient;Spouse    Methods  Explanation;Demonstration;Handout    Comprehension  Returned demonstration;Verbalized understanding          PT Long Term Goals - 07/17/18 1555      PT LONG TERM GOAL #1   Title  Patient will demonstrate she has regainde full shoulder ROM and function post operatively compared to baseline measurements.    Time  8    Period  Weeks    Status  New      Breast Clinic Goals - 07/17/18 1554  Patient will be able to verbalize understanding of pertinent lymphedema risk reduction practices relevant to her diagnosis specifically related to skin care.   Time  1    Period  Days    Status  Achieved      Patient will be able to  return demonstrate and/or verbalize understanding of the post-op home exercise program related to regaining shoulder range of motion.   Time  1    Period  Days    Status  Achieved      Patient will be able to verbalize understanding of the importance of attending the postoperative After Breast Cancer Class for further lymphedema risk reduction education and therapeutic exercise.   Time  1    Period  Days    Status  Achieved            Plan - 07/17/18 1550    Clinical Impression Statement  Patient was diagnosed on 07/02/18 with left grade III triple negative invasive ductal carcinoma breast cancer. It measures 2.1 cm and is located in the upper outer quadrant. There is a known biopsied positive axillary lymph node and the Ki67 is 60%. She is otherwise healthy. Her multidisciplinary medical team met prior to her assessments to determine a recommended treatment plan. She is planning to have neoadjuvant chemotherapy followed by breast surgery to include either a targeted node dissection or axillary lymph node dissection. She will benefit from a post op reassessment to determinePT needs.    History and Personal Factors relevant to plan of care:       Clinical Presentation  Stable    Clinical Presentation due to:  --    Clinical Decision Making  Low    Rehab Potential  Excellent    Clinical Impairments Affecting Rehab Potential  None    PT Frequency  One time visit    PT Treatment/Interventions  ADLs/Self Care Home Management;Patient/family education;Therapeutic exercise    PT Next Visit Plan  Will reassess if referred by MD post op    PT Home Exercise Plan  Post op shoulder ROM HEP    Consulted and Agree with Plan of Care  Patient;Family member/caregiver    Family Member Consulted  Husband       Patient will benefit from skilled therapeutic intervention in order to improve the following deficits and impairments:  Pain, Postural dysfunction, Decreased knowledge of precautions, Impaired UE  functional use, Decreased range of motion  Visit Diagnosis: Malignant neoplasm of upper-outer quadrant of left breast in female, estrogen receptor positive ([place]) - Plan: PT plan of care cert/re-cert  Abnormal posture - Plan: PT plan of care cert/re-cert   Patient will follow up at outpatient cancer rehab 3-4 weeks following surgery.  If the patient requires physical therapy at that time, a specific plan will be dictated and sent to the referring physician for approval. The patient was educated today on appropriate basic range of motion exercises to begin post operatively and the importance of attending the After Breast Cancer class following surgery.  Patient was educated today on lymphedema risk reduction practices as it pertains to recommendations that will benefit the patient immediately following surgery.  She verbalized good understanding.      Problem List Patient Active Problem List   Diagnosis Date Noted  . Malignant neoplasm of upper-outer quadrant of left breast in female, estrogen receptor negative ([place]) 07/16/2018  . Osteoarthritis of left knee 10/12/2015  . Diabetes mellitus ([place]) 09/16/2014  . Hypothyroidism 09/16/2014  . Asthma 09/16/2014  .  Chest pain 09/16/2014  . Pain in the chest    Annia Friendly, PT 07/17/18 4:04 PM  [place], [place], [address] Phone: [phone]   Fax:  [phone]  Name: Kathy Griffin MRN: [id number] Date of Birth: [birth year]

## 2018-07-17 NOTE — Progress Notes (Signed)
Radiation Oncology         [phone] ________________________________  Initial Outpatient Consultation  Name: Kathy Griffin MRN: [id number]  Date: 07/17/2018  DOB: Oct 13, 1955  CC:Worrell, [place], NP (Inactive)  Rolm Bookbinder, MD   REFERRING PHYSICIAN: Rolm Bookbinder, MD  DIAGNOSIS:    ICD-10-CM   1. Malignant neoplasm of upper-outer quadrant of left breast in female, estrogen receptor negative ([place]) C50.412    Z17.1    Cancer Staging Malignant neoplasm of upper-outer quadrant of left breast in female, estrogen receptor negative ([place]) Staging form: Breast, AJCC 8th Edition - Clinical: Stage IIIB (cT2, cN1, cM0, G3, ER-, PR-, HER2-) - Signed by Nicholas Lose, MD on 07/17/2018  CHIEF COMPLAINT: Here to discuss management of left breast cancer  HISTORY OF PRESENT ILLNESS::Kathy Griffin is a 62 y.o. female who has refused mammograms in the past but has done thermography and had a significant increased in heat of the left breast.  On physical exam, there was a hard, palpable lump in the left breast at 11:00-12:00, 2-3 cm from the nipple.   Ultrasound of the left breast on 07/02/18 revealed a 2.1 cm solid mass at the site of the aforementioned palpable lump. There was also a 2.8 cm abnormally enlarged lymph node in the left axilla, highly suggestive of metastatic malignancy. Bilateral diagnostic mammogram on 07/02/18 did not show any other significant masses or calcifications in either breast. 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  ER status: neg; PR status neg, Her2 status neg; Grade 3.  Review of systems is positive for palpitations, lump in breast, thyroid problem (borderline hypo at times), hot flashes  PREVIOUS RADIATION THERAPY: No  PAST MEDICAL HISTORY:  has a past medical history of Asthma, DM (diabetes mellitus) ([place]), Hypothyroidism, and Pneumonia.    PAST SURGICAL HISTORY: Past Surgical History:  Procedure Laterality Date  . KNEE  ARTHROSCOPY Left   . MASS EXCISION Left 08/2001   forehead  . MASS EXCISION Right 08/2001   scalp    FAMILY HISTORY: family history includes Colon polyps in her father; Diabetes in her mother; Heart disease in her father.  SOCIAL HISTORY:  reports that she has never smoked. She has never used smokeless tobacco. She reports current alcohol use. She reports that she does not use drugs.  ALLERGIES: Patient has no known allergies.  MEDICATIONS:  Current Outpatient Medications  Medication Sig Dispense Refill  . albuterol (PROVENTIL HFA;VENTOLIN HFA) 108 (90 BASE) MCG/ACT inhaler Inhale 2 puffs into the lungs every 6 (six) hours as needed for wheezing or shortness of breath.     . Ascorbic Acid (VITAMIN C) 1000 MG tablet Take 1,000 mg by mouth daily.    Marland Kitchen b complex vitamins capsule Take 1 capsule by mouth daily.    Marland Kitchen dexamethasone (DECADRON) 4 MG tablet Take 1 tablet day after chemotherapy and 1 tablet 2 days after chemotherapy with food 8 tablet 0  . fluconazole (DIFLUCAN) 200 MG tablet TAKE 1 TABLET BY MOUTH 2 TIMES WEEKLY  3  . lidocaine-prilocaine (EMLA) cream Apply to affected area once 30 g 3  . LORazepam (ATIVAN) 0.5 MG tablet Take 1 tablet (0.5 mg total) by mouth at bedtime as needed for sleep. 30 tablet 0  . minocycline (MINOCIN,DYNACIN) 100 MG capsule TAKE 2 CAPSULES BY MOUTH TWICE A DAY EVERY OTHER DAY 1HR BEFORE TINDAMAX  2  . Multiple Vitamin (MULTIVITAMIN) capsule Take 1 capsule by mouth daily.    . Omega-3 Fatty Acids (  FISH OIL) 1000 MG CAPS Take 1 capsule by mouth daily.    . ondansetron (ZOFRAN) 8 MG tablet Take 1 tablet (8 mg total) by mouth 2 (two) times daily as needed. Start on the third day after chemotherapy. 30 tablet 1  . prochlorperazine (COMPAZINE) 10 MG tablet Take 1 tablet (10 mg total) by mouth every 6 (six) hours as needed (Nausea or vomiting). 30 tablet 1  . thyroid (ARMOUR) 90 MG tablet Take 90 mg by mouth daily.    Marland Kitchen tinidazole (TINDAMAX) 500 MG tablet TAKE 2  TABLETS BY MOUTH TWICE A DAY EVERY OTHER DAY AFTER MONOCYCLINE  2   No current facility-administered medications for this encounter.     REVIEW OF SYSTEMS: A 10+ POINT REVIEW OF SYSTEMS WAS OBTAINED including neurology, dermatology, psychiatry, cardiac, respiratory, lymph, extremities, GI, GU, Musculoskeletal, constitutional, breasts, reproductive, HEENT.  All pertinent positives are noted in the HPI.  All others are negative.  PHYSICAL EXAM:  Vitals - 1 value per visit 27/78/2423  SYSTOLIC 536  DIASTOLIC 73  Pulse 71  Temperature 98.2  Respirations 18  Weight (lb) 133.4  Height _0   BMI 21.53  VISIT REPORT    General: Alert and oriented, in no acute distress HEENT: Head is normocephalic. Extraocular movements are intact. Oropharynx is clear. Neck: Neck is supple, no palpable cervical or supraclavicular lymphadenopathy. Heart: Regular in rate and rhythm with no murmurs, rubs, or gallops. Chest: Clear to auscultation bilaterally, with no rhonchi, wheezes, or rales. Abdomen: Soft, nontender, nondistended, with no rigidity or guarding. Extremities: No cyanosis or edema. Lymphatics: see Neck Exam Skin: No concerning lesions. Musculoskeletal: symmetric strength and muscle tone throughout. Neurologic: Cranial nerves II through XII are grossly intact. No obvious focalities. Speech is fluent. Coordination is intact. Psychiatric: Judgment and insight are intact. Affect is appropriate. Breasts: 2.5 cm mass at 11:00, left breast . No other palpable masses appreciated in the breasts or axillae.  ECOG = 0  0 - Asymptomatic (Fully active, able to carry on all predisease activities without restriction)  1 - Symptomatic but completely ambulatory (Restricted in physically strenuous activity but ambulatory and able to carry out work of a light or sedentary nature. For example, light housework, office work)  2 - Symptomatic, <50% in bed during the day (Ambulatory and capable of all self care  but unable to carry out any work activities. Up and about more than 50% of waking hours)  3 - Symptomatic, >50% in bed, but not bedbound (Capable of only limited self-care, confined to bed or chair 50% or more of waking hours)  4 - Bedbound (Completely disabled. Cannot carry on any self-care. Totally confined to bed or chair)  5 - Death   Eustace Pen MM, Creech RH, Tormey DC, et al. [phone]). "Toxicity and response criteria of the [facility]". [place] Oncol. 5 (6): 649-55   LABORATORY DATA:  Lab Results  Component Value Date   WBC 5.9 07/17/2018   HGB 14.8 07/17/2018   HCT 44.5 07/17/2018   MCV 87.3 07/17/2018   PLT 236 07/17/2018   CMP     Component Value Date/Time   NA 142 07/17/2018 1211   K 4.6 07/17/2018 1211   CL 105 07/17/2018 1211   CO2 29 07/17/2018 1211   GLUCOSE 118 (H) 07/17/2018 1211   BUN 11 07/17/2018 1211   CREATININE 0.86 07/17/2018 1211   CALCIUM 10.2 07/17/2018 1211   PROT 7.3 07/17/2018 1211   ALBUMIN 4.4 07/17/2018 1211  AST 25 07/17/2018 1211   ALT 23 07/17/2018 1211   ALKPHOS 111 07/17/2018 1211   BILITOT 0.4 07/17/2018 1211   GFRNONAA >60 07/17/2018 1211   GFRAA >60 07/17/2018 1211        RADIOGRAPHY: as above - viewed by me at tumor board today.     IMPRESSION/PLAN: Left Breast Cancer   It was a pleasure meeting the patient today. She anticipates neoadjuvant chemotherapy, then lumpectomy and lymph node surgery.   Staging scans are pending.  We discussed the risks, benefits, and side effects of radiotherapy. I recommend radiotherapy to the left breast and regional nodes to reduce her risk of locoregional recurrence by 2/3.  We discussed that radiation would take approximately 6-7 weeks to complete and that I would give the patient a few weeks to heal following surgery before starting treatment planning.   We spoke about acute effects including skin irritation and fatigue as well as much less common late effects including  internal organ injury or irritation. We spoke about the latest technology that is used to minimize the risk of late effects for patients undergoing radiotherapy to the breast or chest wall. No guarantees of treatment were given. The patient is enthusiastic about proceeding with treatment. I look forward to participating in the patient's care.  I will await her referral back to me for postoperative follow-up and eventual CT simulation/treatment planning.    Eppie Gibson, MD  This document serves as a record of services personally performed by Eppie Gibson, MD. It was created on her behalf by Rae Lips, a trained medical scribe. The creation of this record is based on the scribe's personal observations and the provider's statements to them. This document has been checked and approved by the attending provider.

## 2018-07-17 NOTE — Patient Instructions (Signed)

## 2018-07-17 NOTE — Telephone Encounter (Signed)
No 12/18 los or outgoing referrals.

## 2018-07-17 NOTE — Assessment & Plan Note (Signed)
Screening detected left breast cancer, 2.1 cm at 12 o'clock position 3 cm from the nipple, 2.8 cm abnormal lymph node, biopsy revealed grade 3 IDC with DCIS high-grade, triple negative, Ki-67 60%, T2N1 stage IIIb clinical stage  Pathology and radiology counseling: Discussed with the patient, the details of pathology including the type of breast cancer,the clinical staging, the significance of ER, PR and HER-2/neu receptors and the implications for treatment. After reviewing the pathology in detail, we proceeded to discuss the different treatment options between surgery, radiation, chemotherapy, antiestrogen therapies.  Recommendation: 1.  Neoadjuvant chemotherapy with dose dense Adriamycin and Cytoxan x4 followed by Taxol and carboplatin x12 2.  Breast conserving surgery with targeted lymph node biopsy if possible 3.  Adjuvant radiation  Chemotherapy Counseling: I discussed the risks and benefits of chemotherapy including the risks of nausea/ vomiting, risk of infection from low WBC count, fatigue due to chemo or anemia, bruising or bleeding due to low platelets, mouth sores, loss/ change in taste and decreased appetite. Liver and kidney function will be monitored through out chemotherapy as abnormalities in liver and kidney function may be a side effect of treatment. Cardiac dysfunction due to Adriamycin was discussed in detail. Risk of permanent bone marrow dysfunction and leukemia due to chemo were also discussed.  Plan: 1.  Breast MRI 2.  Port placement 3.  Genetics consultation 4.  Echocardiogram 5.  Chemo class 6.  Upbeat clinical trial  Return to clinic to begin chemotherapy

## 2018-07-17 NOTE — Progress Notes (Signed)
START ON PATHWAY REGIMEN - Breast     A cycle is every 14 days (cycles 1-4):     Doxorubicin      Cyclophosphamide      Pegfilgrastim-xxxx    A cycle is every 21 days (cycles 5-8):     Paclitaxel      Carboplatin   **Always confirm dose/schedule in your pharmacy ordering system**  Patient Characteristics: Preoperative or Nonsurgical Candidate (Clinical Staging), Neoadjuvant Therapy followed by Surgery, Invasive Disease, Chemotherapy, HER2 Negative/Unknown/Equivocal, ER Negative/Unknown, Platinum Therapy Indicated Therapeutic Status: Preoperative or Nonsurgical Candidate (Clinical Staging) AJCC M Category: cM0 AJCC Grade: G3 Breast Surgical Plan: Neoadjuvant Therapy followed by Surgery ER Status: Negative (-) AJCC 8 Stage Grouping: IIIB HER2 Status: Negative (-) AJCC T Category: cT2 AJCC N Category: cN1 PR Status: Negative (-) Type of Therapy: Platinum Therapy Indicated Intent of Therapy: Curative Intent, Discussed with Patient 

## 2018-07-17 NOTE — Progress Notes (Signed)
07/17/18 at 4:24pm - Dr. Gudena referred the pt to the UpBeat study.  The research nurse met with the pt and her husband for 25 minutes along with Jasmine Phelps, research nurse.  The research nurse answered all of the pt's questions.  The pt was told that her participation is voluntary.  The pt said that she wanted some time to consider her participation.  The research nurse will follow up with the pt next week.  The pt was given a copy of the consent form, hipaa form, [facility] research brochure, and the Rn's business card.   Nikki L. Eldreth RN, BSN, CCRP Clinical Research Nurse 07/17/2018 4:33 PM   07/22/18 at 10:47am - The research nurse called the pt to see if she had made a decision about participating in the study.  The pt said that she did not want to participate in the trial.  The pt said that she has too many appts, scans, and procedures now.  She said that she does not want to "take on anything else now".  The pt was thanked for her time and consideration of this study.  The pt agreed to meet with the research nurse in the future to discuss the DCP-001 study.  Nikki L. Eldreth RN, BSN, CCRP Clinical Research Nurse 07/22/2018 10:51 AM  

## 2018-07-18 ENCOUNTER — Encounter: Payer: Self-pay | Admitting: General Practice

## 2018-07-18 ENCOUNTER — Telehealth: Payer: Self-pay | Admitting: *Deleted

## 2018-07-18 ENCOUNTER — Telehealth: Payer: Self-pay | Admitting: Hematology and Oncology

## 2018-07-18 NOTE — Telephone Encounter (Signed)
Spoke with patient to confirm rescheduled appointments for echo, chemo education, ct/bone scan, echo, and MRI.  Patient aware of all appointment dates and times.   Oncology Nurse Navigator Documentation  Navigator Location: [facility] (07/18/18 0900)   )Navigator Encounter Type: Telephone;[facility] Follow-up (07/18/18 0900) Telephone: Outgoing Call;Clinic/[facility] Follow-up (07/18/18 0900)         Genetic Counseling Type: Non-Urgent (07/18/18 0900)                 Interventions: Coordination of Care (07/18/18 0900)   Coordination of Care: Appts (07/18/18 0900)                  Time Spent with Patient: 30 (07/18/18 0900)

## 2018-07-18 NOTE — Telephone Encounter (Signed)
Patient requested that records be faxed to Dr. Evalee Mutton at [phone], Release [id number]

## 2018-07-18 NOTE — Progress Notes (Signed)
[place] Psychosocial Distress Screening [place] by phone following Breast [facility]idisciplinary [facility] to introduce [place] team/resources, reviewing distress screen per protocol.  The patient scored a 6 on the Psychosocial Distress Thermometer which indicates moderate distress. Also assessed for distress and other psychosocial needs.   ONCBCN DISTRESS SCREENING 07/18/2018  Screening Type Initial Screening  Distress experienced in past week (1-10) 6  Emotional problem type Adjusting to illness  Referral to support programs Yes   Devra's voice and reflections sounded steady in the midst of "feeling like a deer in the headlights...because we were hoping for a diagnosis that wasn't so hard to treat." Normalized feelings and began introducing [place] as resource throughout her cancer experience. She welcomes f/u during infusion to speak in more detail and reports good support.   Follow up needed: Yes.  Plan to f/u in infusion, but please also page if immediate needs arise or circumstances change. Thank you.   [place], [place], [facility] Pager [phone] Voicemail [phone]

## 2018-07-19 ENCOUNTER — Ambulatory Visit (HOSPITAL_COMMUNITY)
Admission: RE | Admit: 2018-07-19 | Discharge: 2018-07-19 | Disposition: A | Discharge status: Home / Self Care | Payer: 59 | Source: Ambulatory Visit | Attending: Hematology and Oncology | Admitting: Hematology and Oncology

## 2018-07-19 ENCOUNTER — Telehealth: Payer: Self-pay | Admitting: Hematology and Oncology

## 2018-07-19 ENCOUNTER — Inpatient Hospital Stay: Payer: 59

## 2018-07-19 DIAGNOSIS — Z171 Estrogen receptor negative status [ER-]: Secondary | ICD-10-CM | POA: Diagnosis not present

## 2018-07-19 DIAGNOSIS — E119 Type 2 diabetes mellitus without complications: Secondary | ICD-10-CM | POA: Insufficient documentation

## 2018-07-19 DIAGNOSIS — C50412 Malignant neoplasm of upper-outer quadrant of left female breast: Secondary | ICD-10-CM | POA: Diagnosis present

## 2018-07-19 NOTE — Progress Notes (Signed)
  Echocardiogram 2D Echocardiogram has been performed.  Kathy Griffin 07/19/2018, 10:44 AM

## 2018-07-19 NOTE — Telephone Encounter (Signed)
Scheduled appt per 12/18 sch message - pt is to get an updated schedule next visit.

## 2018-07-19 NOTE — Telephone Encounter (Signed)
Scheduled appt per 12/19 sch message - pt to get an updated schedule next visit with MD  - also left message for patient with appt date and time

## 2018-07-20 ENCOUNTER — Encounter (HOSPITAL_COMMUNITY): Payer: Self-pay

## 2018-07-20 ENCOUNTER — Ambulatory Visit (HOSPITAL_COMMUNITY)
Admission: RE | Admit: 2018-07-20 | Discharge: 2018-07-20 | Disposition: A | Discharge status: Home / Self Care | Payer: 59 | Source: Ambulatory Visit | Attending: Hematology and Oncology | Admitting: Hematology and Oncology

## 2018-07-20 DIAGNOSIS — C50412 Malignant neoplasm of upper-outer quadrant of left female breast: Secondary | ICD-10-CM

## 2018-07-20 DIAGNOSIS — Z171 Estrogen receptor negative status [ER-]: Principal | ICD-10-CM

## 2018-07-20 MED ORDER — GADOBUTROL 1 MMOL/ML IV SOLN: 6.0000 mL | Freq: Once | INTRAVENOUS | Status: DC | PRN

## 2018-07-23 ENCOUNTER — Other Ambulatory Visit (HOSPITAL_COMMUNITY): Payer: 59

## 2018-07-25 ENCOUNTER — Other Ambulatory Visit (HOSPITAL_COMMUNITY): Payer: 59

## 2018-07-25 ENCOUNTER — Other Ambulatory Visit: Payer: 59

## 2018-07-27 ENCOUNTER — Ambulatory Visit (HOSPITAL_COMMUNITY)
Admission: RE | Admit: 2018-07-27 | Discharge: 2018-07-27 | Disposition: A | Discharge status: Home / Self Care | Payer: 59 | Source: Ambulatory Visit | Attending: Hematology and Oncology | Admitting: Hematology and Oncology

## 2018-07-27 DIAGNOSIS — C50412 Malignant neoplasm of upper-outer quadrant of left female breast: Secondary | ICD-10-CM | POA: Insufficient documentation

## 2018-07-27 DIAGNOSIS — Z171 Estrogen receptor negative status [ER-]: Secondary | ICD-10-CM | POA: Diagnosis not present

## 2018-07-27 DIAGNOSIS — C773 Secondary and unspecified malignant neoplasm of axilla and upper limb lymph nodes: Secondary | ICD-10-CM | POA: Insufficient documentation

## 2018-07-27 MED ORDER — GADOBUTROL 1 MMOL/ML IV SOLN
7.5000 mL | Freq: Once | INTRAVENOUS | Status: AC | PRN
  Administered 2018-07-27: 6 mL via INTRAVENOUS

## 2018-07-29 ENCOUNTER — Encounter (HOSPITAL_COMMUNITY): Payer: 59

## 2018-07-29 ENCOUNTER — Telehealth: Payer: Self-pay | Admitting: *Deleted

## 2018-07-29 ENCOUNTER — Ambulatory Visit (HOSPITAL_COMMUNITY): Payer: 59

## 2018-07-29 NOTE — Telephone Encounter (Signed)
Received call from patient stating that she may be participating in a study at [facility] and needed to cancel her scans because they wanted everything at one place.  Appointments cancelled for CT/Bone scan.  She states she will let me know about treatment once they have made a decision.

## 2018-07-30 ENCOUNTER — Telehealth: Payer: Self-pay | Admitting: *Deleted

## 2018-07-30 NOTE — Telephone Encounter (Signed)
Left message for a return phone call.   Received message from [place] that patient cancelled her port placement and was going to be treated at [facility].  Patient has appointments here 1/3 and was calling to confirm with her.

## 2018-07-30 NOTE — Telephone Encounter (Signed)
Received call back from patient that she will be receiving chemotherapy at [facility].  She may still come back here for radiation and surgery.  She will let us know and they can refer her back when and if she is ready.  All appointments have been cancelled.

## 2018-07-31 DIAGNOSIS — Z9221 Personal history of antineoplastic chemotherapy: Secondary | ICD-10-CM

## 2018-07-31 HISTORY — DX: Personal history of antineoplastic chemotherapy: Z92.21

## 2018-08-01 ENCOUNTER — Encounter (HOSPITAL_COMMUNITY): Admission: RE | Payer: Self-pay | Source: Home / Self Care

## 2018-08-01 ENCOUNTER — Ambulatory Visit (HOSPITAL_COMMUNITY): Admission: RE | Admit: 2018-08-01 | Payer: 59 | Source: Home / Self Care | Admitting: General Surgery

## 2018-08-01 SURGERY — INSERTION, TUNNELED CENTRAL VENOUS DEVICE, WITH PORT
Anesthesia: General

## 2018-08-02 ENCOUNTER — Inpatient Hospital Stay: Payer: 59 | Admitting: Hematology and Oncology

## 2018-08-02 ENCOUNTER — Inpatient Hospital Stay: Payer: 59

## 2018-08-08 ENCOUNTER — Other Ambulatory Visit: Payer: 59

## 2018-08-08 ENCOUNTER — Ambulatory Visit: Payer: 59 | Admitting: Hematology and Oncology

## 2018-08-12 ENCOUNTER — Encounter: Payer: 59 | Admitting: Genetics

## 2018-08-16 ENCOUNTER — Other Ambulatory Visit: Payer: 59

## 2018-08-16 ENCOUNTER — Ambulatory Visit: Payer: 59 | Admitting: Hematology and Oncology

## 2018-08-16 ENCOUNTER — Ambulatory Visit: Payer: 59

## 2018-08-30 ENCOUNTER — Ambulatory Visit: Payer: 59

## 2018-08-30 ENCOUNTER — Other Ambulatory Visit: Payer: 59

## 2018-08-30 ENCOUNTER — Ambulatory Visit: Payer: 59 | Admitting: Hematology and Oncology

## 2018-09-13 ENCOUNTER — Ambulatory Visit: Payer: 59 | Admitting: Adult Health

## 2018-09-13 ENCOUNTER — Ambulatory Visit: Payer: 59

## 2018-09-13 ENCOUNTER — Other Ambulatory Visit: Payer: 59

## 2018-11-18 ENCOUNTER — Other Ambulatory Visit (HOSPITAL_COMMUNITY): Payer: Self-pay | Admitting: Family Medicine

## 2018-11-18 ENCOUNTER — Other Ambulatory Visit: Payer: Self-pay | Admitting: Family Medicine

## 2018-11-18 DIAGNOSIS — C7981 Secondary malignant neoplasm of breast: Secondary | ICD-10-CM

## 2018-11-18 DIAGNOSIS — E039 Hypothyroidism, unspecified: Secondary | ICD-10-CM

## 2018-11-18 DIAGNOSIS — A6920 Lyme disease, unspecified: Secondary | ICD-10-CM

## 2018-11-20 ENCOUNTER — Encounter: Payer: Self-pay | Admitting: Physical Therapy

## 2018-11-20 ENCOUNTER — Telehealth: Payer: Self-pay | Admitting: *Deleted

## 2018-11-20 NOTE — Telephone Encounter (Signed)
Spoke with patient to check in to see if she had decided where she was going to get surgery and xrt. She states she finished chemotherapy in [place] with an integrative MD and she was going to get a PET scan pending insurance approval to see if surgery was needed.  Informed her that [place] office has been trying to contact her regarding her plans for surgery.  She states she has not received any messages. Encouraged her to call with any questions or concerns.  Patient verbalized understanding.

## 2018-11-22 ENCOUNTER — Encounter (HOSPITAL_COMMUNITY): Payer: Self-pay

## 2018-11-22 ENCOUNTER — Ambulatory Visit (HOSPITAL_COMMUNITY): Payer: 59

## 2018-11-22 ENCOUNTER — Telehealth: Payer: Self-pay | Admitting: *Deleted

## 2018-11-22 ENCOUNTER — Encounter (HOSPITAL_COMMUNITY): Payer: 59

## 2018-11-22 NOTE — Telephone Encounter (Signed)
Spoke with patient today regarding the PET scan.  Explained to her that we don't usually get PET scans post neoadjuvant chemo.  We would get a MRI to assess response to chemo instead.  Also explained that PET scans are denied by most insurance companies unless they have had CT scans that warrant a PET scan.  She had her previous MRI here at [facility]. I'm not sure what her treatment plan included in [place] but we were interrupted before I could ask. Patient stated that she was getting a call from her medical oncologist in [place] and that she would call me back if needed.

## 2018-11-25 ENCOUNTER — Encounter: Payer: Self-pay | Admitting: Physical Therapy

## 2018-11-26 ENCOUNTER — Other Ambulatory Visit: Payer: Self-pay | Admitting: Internal Medicine

## 2018-11-26 ENCOUNTER — Other Ambulatory Visit (HOSPITAL_COMMUNITY): Payer: Self-pay | Admitting: Internal Medicine

## 2018-11-26 ENCOUNTER — Telehealth: Payer: Self-pay | Admitting: *Deleted

## 2018-11-26 ENCOUNTER — Encounter: Payer: Self-pay | Admitting: *Deleted

## 2018-11-26 DIAGNOSIS — C50412 Malignant neoplasm of upper-outer quadrant of left female breast: Secondary | ICD-10-CM

## 2018-11-26 NOTE — Telephone Encounter (Signed)
Received a message from patient's husband stating he hasn't heard anything about her PET scan being scheduled. This RN called central scheduling to check and they have not received the order.  They said once they receive the order from her physician in [place] they will put the order and call her with an appointment.    I called pt's husband back and left message with this information and the fax number that her MD needs to fax the order to.

## 2018-12-04 ENCOUNTER — Other Ambulatory Visit: Payer: Self-pay

## 2018-12-04 ENCOUNTER — Ambulatory Visit (HOSPITAL_COMMUNITY)
Admission: RE | Admit: 2018-12-04 | Discharge: 2018-12-04 | Disposition: A | Discharge status: Home / Self Care | Payer: 59 | Source: Ambulatory Visit | Attending: Internal Medicine | Admitting: Internal Medicine

## 2018-12-04 DIAGNOSIS — C50412 Malignant neoplasm of upper-outer quadrant of left female breast: Secondary | ICD-10-CM | POA: Insufficient documentation

## 2018-12-04 LAB — GLUCOSE, CAPILLARY: Glucose-Capillary: 87 mg/dL (ref 70–99)

## 2018-12-04 MED ORDER — FLUDEOXYGLUCOSE F - 18 (FDG) INJECTION
6.5000 | Freq: Once | INTRAVENOUS | Status: AC
  Administered 2018-12-04: 6.5 via INTRAVENOUS

## 2018-12-05 ENCOUNTER — Other Ambulatory Visit: Payer: Self-pay

## 2018-12-05 ENCOUNTER — Inpatient Hospital Stay: Payer: 59 | Attending: Hematology and Oncology

## 2018-12-05 DIAGNOSIS — Z171 Estrogen receptor negative status [ER-]: Secondary | ICD-10-CM | POA: Diagnosis not present

## 2018-12-05 DIAGNOSIS — Z452 Encounter for adjustment and management of vascular access device: Secondary | ICD-10-CM | POA: Insufficient documentation

## 2018-12-05 DIAGNOSIS — Z95828 Presence of other vascular implants and grafts: Secondary | ICD-10-CM

## 2018-12-05 DIAGNOSIS — C50212 Malignant neoplasm of upper-inner quadrant of left female breast: Secondary | ICD-10-CM | POA: Diagnosis present

## 2018-12-05 MED ORDER — SODIUM CHLORIDE 0.9% FLUSH
10.0000 mL | INTRAVENOUS | Status: DC | PRN
  Administered 2018-12-05: 10 mL via INTRAVENOUS
  Filled 2018-12-05: qty 10

## 2018-12-05 MED ORDER — HEPARIN SOD (PORK) LOCK FLUSH 100 UNIT/ML IV SOLN
500.0000 [IU] | Freq: Once | INTRAVENOUS | Status: AC
  Administered 2018-12-05: 500 [IU] via INTRAVENOUS
  Filled 2018-12-05: qty 5

## 2018-12-05 NOTE — Patient Instructions (Signed)

## 2018-12-06 ENCOUNTER — Other Ambulatory Visit: Payer: Self-pay | Admitting: General Surgery

## 2018-12-06 DIAGNOSIS — C50412 Malignant neoplasm of upper-outer quadrant of left female breast: Secondary | ICD-10-CM

## 2018-12-13 ENCOUNTER — Other Ambulatory Visit: Payer: Self-pay | Admitting: General Surgery

## 2018-12-13 DIAGNOSIS — C50212 Malignant neoplasm of upper-inner quadrant of left female breast: Secondary | ICD-10-CM

## 2018-12-13 DIAGNOSIS — C50919 Malignant neoplasm of unspecified site of unspecified female breast: Secondary | ICD-10-CM

## 2018-12-16 ENCOUNTER — Other Ambulatory Visit: Payer: Self-pay

## 2018-12-16 ENCOUNTER — Ambulatory Visit
Admission: RE | Admit: 2018-12-16 | Discharge: 2018-12-16 | Disposition: A | Discharge status: Home / Self Care | Payer: 59 | Source: Ambulatory Visit | Attending: General Surgery | Admitting: General Surgery

## 2018-12-16 DIAGNOSIS — C50919 Malignant neoplasm of unspecified site of unspecified female breast: Secondary | ICD-10-CM

## 2018-12-25 ENCOUNTER — Other Ambulatory Visit: Payer: 59

## 2018-12-27 ENCOUNTER — Other Ambulatory Visit: Payer: Self-pay | Admitting: General Surgery

## 2018-12-27 DIAGNOSIS — C50212 Malignant neoplasm of upper-inner quadrant of left female breast: Secondary | ICD-10-CM

## 2018-12-31 ENCOUNTER — Other Ambulatory Visit: Payer: Self-pay | Admitting: General Surgery

## 2018-12-31 DIAGNOSIS — Z171 Estrogen receptor negative status [ER-]: Secondary | ICD-10-CM

## 2018-12-31 DIAGNOSIS — C50212 Malignant neoplasm of upper-inner quadrant of left female breast: Secondary | ICD-10-CM

## 2019-01-08 ENCOUNTER — Encounter (HOSPITAL_BASED_OUTPATIENT_CLINIC_OR_DEPARTMENT_OTHER): Payer: Self-pay | Admitting: *Deleted

## 2019-01-08 ENCOUNTER — Other Ambulatory Visit: Payer: Self-pay

## 2019-01-11 ENCOUNTER — Other Ambulatory Visit (HOSPITAL_COMMUNITY)
Admission: RE | Admit: 2019-01-11 | Discharge: 2019-01-11 | Disposition: A | Discharge status: Home / Self Care | Payer: 59 | Source: Ambulatory Visit | Attending: General Surgery | Admitting: General Surgery

## 2019-01-11 DIAGNOSIS — Z1159 Encounter for screening for other viral diseases: Secondary | ICD-10-CM | POA: Insufficient documentation

## 2019-01-13 LAB — NOVEL CORONAVIRUS, NAA (HOSP ORDER, SEND-OUT TO REF LAB; TAT 18-24 HRS): SARS-CoV-2, NAA: NOT DETECTED

## 2019-01-14 ENCOUNTER — Ambulatory Visit
Admission: RE | Admit: 2019-01-14 | Discharge: 2019-01-14 | Disposition: A | Discharge status: Home / Self Care | Payer: 59 | Source: Ambulatory Visit | Attending: General Surgery | Admitting: General Surgery

## 2019-01-14 ENCOUNTER — Other Ambulatory Visit: Payer: Self-pay

## 2019-01-14 DIAGNOSIS — C50212 Malignant neoplasm of upper-inner quadrant of left female breast: Secondary | ICD-10-CM

## 2019-01-14 DIAGNOSIS — Z171 Estrogen receptor negative status [ER-]: Secondary | ICD-10-CM

## 2019-01-15 ENCOUNTER — Encounter (HOSPITAL_BASED_OUTPATIENT_CLINIC_OR_DEPARTMENT_OTHER): Payer: Self-pay

## 2019-01-15 ENCOUNTER — Ambulatory Visit (HOSPITAL_BASED_OUTPATIENT_CLINIC_OR_DEPARTMENT_OTHER): Payer: 59 | Admitting: Anesthesiology

## 2019-01-15 ENCOUNTER — Other Ambulatory Visit: Payer: Self-pay

## 2019-01-15 ENCOUNTER — Ambulatory Visit
Admission: RE | Admit: 2019-01-15 | Discharge: 2019-01-15 | Disposition: A | Discharge status: Home / Self Care | Payer: 59 | Source: Ambulatory Visit | Attending: General Surgery | Admitting: General Surgery

## 2019-01-15 ENCOUNTER — Encounter (HOSPITAL_BASED_OUTPATIENT_CLINIC_OR_DEPARTMENT_OTHER)
Admission: RE | Disposition: A | Discharge status: Home / Self Care | Payer: Self-pay | Source: Home / Self Care | Attending: General Surgery

## 2019-01-15 ENCOUNTER — Ambulatory Visit (HOSPITAL_COMMUNITY)
Admission: RE | Admit: 2019-01-15 | Discharge: 2019-01-15 | Disposition: A | Discharge status: Home / Self Care | Payer: 59 | Source: Ambulatory Visit | Attending: General Surgery | Admitting: General Surgery

## 2019-01-15 ENCOUNTER — Ambulatory Visit (HOSPITAL_BASED_OUTPATIENT_CLINIC_OR_DEPARTMENT_OTHER)
Admission: RE | Admit: 2019-01-15 | Discharge: 2019-01-15 | Disposition: A | Discharge status: Home / Self Care | Payer: 59 | Attending: General Surgery | Admitting: General Surgery

## 2019-01-15 ENCOUNTER — Ambulatory Visit (HOSPITAL_COMMUNITY): Payer: 59

## 2019-01-15 DIAGNOSIS — Z171 Estrogen receptor negative status [ER-]: Secondary | ICD-10-CM | POA: Insufficient documentation

## 2019-01-15 DIAGNOSIS — C773 Secondary and unspecified malignant neoplasm of axilla and upper limb lymph nodes: Secondary | ICD-10-CM | POA: Insufficient documentation

## 2019-01-15 DIAGNOSIS — J45909 Unspecified asthma, uncomplicated: Secondary | ICD-10-CM | POA: Insufficient documentation

## 2019-01-15 DIAGNOSIS — C50412 Malignant neoplasm of upper-outer quadrant of left female breast: Secondary | ICD-10-CM | POA: Diagnosis not present

## 2019-01-15 DIAGNOSIS — C50212 Malignant neoplasm of upper-inner quadrant of left female breast: Secondary | ICD-10-CM

## 2019-01-15 DIAGNOSIS — Z79890 Hormone replacement therapy (postmenopausal): Secondary | ICD-10-CM | POA: Diagnosis not present

## 2019-01-15 DIAGNOSIS — Z79899 Other long term (current) drug therapy: Secondary | ICD-10-CM | POA: Diagnosis not present

## 2019-01-15 DIAGNOSIS — Z9221 Personal history of antineoplastic chemotherapy: Secondary | ICD-10-CM | POA: Diagnosis not present

## 2019-01-15 HISTORY — DX: Prediabetes: R73.03

## 2019-01-15 HISTORY — DX: Anxiety disorder, unspecified: F41.9

## 2019-01-15 HISTORY — PX: BREAST LUMPECTOMY WITH RADIOACTIVE SEED AND SENTINEL LYMPH NODE BIOPSY: SHX6550

## 2019-01-15 SURGERY — BREAST LUMPECTOMY WITH RADIOACTIVE SEED AND SENTINEL LYMPH NODE BIOPSY
Anesthesia: General | Site: Breast | Laterality: Left

## 2019-01-15 MED ORDER — ACETAMINOPHEN 500 MG PO TABS
1000.0000 mg | ORAL_TABLET | ORAL | Status: AC
  Administered 2019-01-15: 1000 mg via ORAL

## 2019-01-15 MED ORDER — MEPERIDINE HCL 25 MG/ML IJ SOLN: 6.2500 mg | INTRAMUSCULAR | Status: DC | PRN

## 2019-01-15 MED ORDER — CHLORHEXIDINE GLUCONATE CLOTH 2 % EX PADS: 6.0000 | MEDICATED_PAD | Freq: Once | CUTANEOUS | Status: DC

## 2019-01-15 MED ORDER — FENTANYL CITRATE (PF) 100 MCG/2ML IJ SOLN
25.0000 ug | INTRAMUSCULAR | Status: DC | PRN
  Administered 2019-01-15: 50 ug via INTRAVENOUS

## 2019-01-15 MED ORDER — BUPIVACAINE HCL (PF) 0.25 % IJ SOLN
INTRAMUSCULAR | Status: DC | PRN
  Administered 2019-01-15: 5 mL

## 2019-01-15 MED ORDER — FENTANYL CITRATE (PF) 100 MCG/2ML IJ SOLN
INTRAMUSCULAR | Status: AC
  Filled 2019-01-15: qty 2

## 2019-01-15 MED ORDER — TRAMADOL HCL 50 MG PO TABS: 100.0000 mg | ORAL_TABLET | Freq: Four times a day (QID) | ORAL | 0 refills | Status: AC | PRN

## 2019-01-15 MED ORDER — OXYCODONE HCL 5 MG PO TABS: 5.0000 mg | ORAL_TABLET | Freq: Once | ORAL | Status: DC | PRN

## 2019-01-15 MED ORDER — CEFAZOLIN SODIUM-DEXTROSE 2-4 GM/100ML-% IV SOLN
2.0000 g | INTRAVENOUS | Status: AC
  Administered 2019-01-15: 2 g via INTRAVENOUS

## 2019-01-15 MED ORDER — GABAPENTIN 100 MG PO CAPS
100.0000 mg | ORAL_CAPSULE | ORAL | Status: AC
  Administered 2019-01-15: 100 mg via ORAL

## 2019-01-15 MED ORDER — FENTANYL CITRATE (PF) 100 MCG/2ML IJ SOLN
50.0000 ug | INTRAMUSCULAR | Status: DC | PRN
  Administered 2019-01-15: 50 ug via INTRAVENOUS

## 2019-01-15 MED ORDER — ONDANSETRON HCL 4 MG/2ML IJ SOLN
INTRAMUSCULAR | Status: AC
  Filled 2019-01-15: qty 2

## 2019-01-15 MED ORDER — EPHEDRINE 5 MG/ML INJ
INTRAVENOUS | Status: AC
  Filled 2019-01-15: qty 10

## 2019-01-15 MED ORDER — FENTANYL CITRATE (PF) 100 MCG/2ML IJ SOLN
INTRAMUSCULAR | Status: DC | PRN
  Administered 2019-01-15 (×4): 25 ug via INTRAVENOUS

## 2019-01-15 MED ORDER — GABAPENTIN 100 MG PO CAPS
ORAL_CAPSULE | ORAL | Status: AC
  Filled 2019-01-15: qty 1

## 2019-01-15 MED ORDER — LACTATED RINGERS IV SOLN
INTRAVENOUS | Status: DC
  Administered 2019-01-15 (×2): via INTRAVENOUS

## 2019-01-15 MED ORDER — TECHNETIUM TC 99M SULFUR COLLOID FILTERED
1.0000 | Freq: Once | INTRAVENOUS | Status: AC | PRN
  Administered 2019-01-15: 1 via INTRADERMAL

## 2019-01-15 MED ORDER — CEFAZOLIN SODIUM-DEXTROSE 2-4 GM/100ML-% IV SOLN
INTRAVENOUS | Status: AC
  Filled 2019-01-15: qty 100

## 2019-01-15 MED ORDER — BUPIVACAINE-EPINEPHRINE (PF) 0.5% -1:200000 IJ SOLN
INTRAMUSCULAR | Status: DC | PRN
  Administered 2019-01-15: 30 mL

## 2019-01-15 MED ORDER — MIDAZOLAM HCL 2 MG/2ML IJ SOLN
INTRAMUSCULAR | Status: AC
  Filled 2019-01-15: qty 2

## 2019-01-15 MED ORDER — KETOROLAC TROMETHAMINE 15 MG/ML IJ SOLN
INTRAMUSCULAR | Status: AC
  Filled 2019-01-15: qty 1

## 2019-01-15 MED ORDER — KETOROLAC TROMETHAMINE 15 MG/ML IJ SOLN
15.0000 mg | INTRAMUSCULAR | Status: DC
  Administered 2019-01-15: 15 mg via INTRAVENOUS

## 2019-01-15 MED ORDER — ACETAMINOPHEN 10 MG/ML IV SOLN: 1000.0000 mg | Freq: Once | INTRAVENOUS | Status: DC | PRN

## 2019-01-15 MED ORDER — SCOPOLAMINE 1 MG/3DAYS TD PT72: 1.0000 | MEDICATED_PATCH | Freq: Once | TRANSDERMAL | Status: DC | PRN

## 2019-01-15 MED ORDER — ACETAMINOPHEN 500 MG PO TABS
ORAL_TABLET | ORAL | Status: AC
  Filled 2019-01-15: qty 2

## 2019-01-15 MED ORDER — ACETAMINOPHEN 325 MG PO TABS: 325.0000 mg | ORAL_TABLET | Freq: Once | ORAL | Status: DC | PRN

## 2019-01-15 MED ORDER — OXYCODONE HCL 5 MG/5ML PO SOLN: 5.0000 mg | Freq: Once | ORAL | Status: DC | PRN

## 2019-01-15 MED ORDER — DEXAMETHASONE SODIUM PHOSPHATE 4 MG/ML IJ SOLN
INTRAMUSCULAR | Status: DC | PRN
  Administered 2019-01-15: 10 mg via INTRAVENOUS

## 2019-01-15 MED ORDER — PROMETHAZINE HCL 25 MG/ML IJ SOLN: 6.2500 mg | INTRAMUSCULAR | Status: DC | PRN

## 2019-01-15 MED ORDER — LIDOCAINE 2% (20 MG/ML) 5 ML SYRINGE
INTRAMUSCULAR | Status: DC | PRN
  Administered 2019-01-15: 40 mg via INTRAVENOUS

## 2019-01-15 MED ORDER — LIDOCAINE 2% (20 MG/ML) 5 ML SYRINGE
INTRAMUSCULAR | Status: AC
  Filled 2019-01-15: qty 5

## 2019-01-15 MED ORDER — DEXAMETHASONE SODIUM PHOSPHATE 10 MG/ML IJ SOLN
INTRAMUSCULAR | Status: AC
  Filled 2019-01-15: qty 1

## 2019-01-15 MED ORDER — EPHEDRINE SULFATE-NACL 50-0.9 MG/10ML-% IV SOSY
PREFILLED_SYRINGE | INTRAVENOUS | Status: DC | PRN
  Administered 2019-01-15 (×3): 10 mg via INTRAVENOUS

## 2019-01-15 MED ORDER — LACTATED RINGERS IV SOLN: INTRAVENOUS | Status: DC

## 2019-01-15 MED ORDER — MIDAZOLAM HCL 2 MG/2ML IJ SOLN
1.0000 mg | INTRAMUSCULAR | Status: DC | PRN
  Administered 2019-01-15: 2 mg via INTRAVENOUS

## 2019-01-15 MED ORDER — ONDANSETRON HCL 4 MG/2ML IJ SOLN
INTRAMUSCULAR | Status: DC | PRN
  Administered 2019-01-15: 4 mg via INTRAVENOUS

## 2019-01-15 MED ORDER — PROPOFOL 10 MG/ML IV BOLUS
INTRAVENOUS | Status: DC | PRN
  Administered 2019-01-15: 200 mg via INTRAVENOUS

## 2019-01-15 MED ORDER — ACETAMINOPHEN 160 MG/5ML PO SOLN: 325.0000 mg | Freq: Once | ORAL | Status: DC | PRN

## 2019-01-15 MED ORDER — SODIUM CHLORIDE (PF) 0.9 % IJ SOLN
INTRAVENOUS | Status: DC | PRN
  Administered 2019-01-15: 10:00:00 3 mL

## 2019-01-15 SURGICAL SUPPLY — 61 items
ADH SKN CLS APL DERMABOND .7 (GAUZE/BANDAGES/DRESSINGS) ×1
APL PRP STRL LF DISP 70% ISPRP (MISCELLANEOUS) ×1
APPLIER CLIP 9.375 MED OPEN (MISCELLANEOUS) ×3
APR CLP MED 9.3 20 MLT OPN (MISCELLANEOUS) ×1
BINDER BREAST LRG (GAUZE/BANDAGES/DRESSINGS) IMPLANT
BINDER BREAST MEDIUM (GAUZE/BANDAGES/DRESSINGS) ×2 IMPLANT
BINDER BREAST XLRG (GAUZE/BANDAGES/DRESSINGS) IMPLANT
BINDER BREAST XXLRG (GAUZE/BANDAGES/DRESSINGS) IMPLANT
BLADE SURG 15 STRL LF DISP TIS (BLADE) ×1 IMPLANT
BLADE SURG 15 STRL SS (BLADE) ×6
CANISTER SUC SOCK COL 7IN (MISCELLANEOUS) IMPLANT
CANISTER SUCT 1200ML W/VALVE (MISCELLANEOUS) IMPLANT
CHLORAPREP W/TINT 26 (MISCELLANEOUS) ×3 IMPLANT
CLIP APPLIE 9.375 MED OPEN (MISCELLANEOUS) IMPLANT
CLIP VESOCCLUDE SM WIDE 6/CT (CLIP) ×3 IMPLANT
CLOSURE WOUND 1/2 X4 (GAUZE/BANDAGES/DRESSINGS) ×1
COVER BACK TABLE REUSABLE LG (DRAPES) ×3 IMPLANT
COVER MAYO STAND REUSABLE (DRAPES) ×3 IMPLANT
COVER PROBE W GEL 5X96 (DRAPES) ×3 IMPLANT
DECANTER SPIKE VIAL GLASS SM (MISCELLANEOUS) IMPLANT
DERMABOND ADVANCED (GAUZE/BANDAGES/DRESSINGS) ×2
DERMABOND ADVANCED .7 DNX12 (GAUZE/BANDAGES/DRESSINGS) ×1 IMPLANT
DRAPE LAPAROSCOPIC ABDOMINAL (DRAPES) ×3 IMPLANT
DRAPE UTILITY XL STRL (DRAPES) ×3 IMPLANT
ELECT COATED BLADE 2.86 ST (ELECTRODE) ×3 IMPLANT
ELECT REM PT RETURN 9FT ADLT (ELECTROSURGICAL) ×3
ELECTRODE REM PT RTRN 9FT ADLT (ELECTROSURGICAL) ×1 IMPLANT
GLOVE BIO SURGEON STRL SZ7 (GLOVE) ×8 IMPLANT
GLOVE BIOGEL PI IND STRL 7.5 (GLOVE) ×1 IMPLANT
GLOVE BIOGEL PI INDICATOR 7.5 (GLOVE) ×2
GOWN STRL REUS W/ TWL LRG LVL3 (GOWN DISPOSABLE) ×2 IMPLANT
GOWN STRL REUS W/TWL LRG LVL3 (GOWN DISPOSABLE) ×6
HEMOSTAT ARISTA ABSORB 3G PWDR (HEMOSTASIS) IMPLANT
ILLUMINATOR WAVEGUIDE N/F (MISCELLANEOUS) IMPLANT
KIT MARKER MARGIN INK (KITS) ×3 IMPLANT
LIGHT WAVEGUIDE WIDE FLAT (MISCELLANEOUS) IMPLANT
NDL HYPO 25X1 1.5 SAFETY (NEEDLE) ×1 IMPLANT
NDL SAFETY ECLIPSE 18X1.5 (NEEDLE) IMPLANT
NEEDLE HYPO 18GX1.5 SHARP (NEEDLE) ×3
NEEDLE HYPO 25X1 1.5 SAFETY (NEEDLE) ×6 IMPLANT
NS IRRIG 1000ML POUR BTL (IV SOLUTION) ×2 IMPLANT
PACK BASIN DAY SURGERY FS (CUSTOM PROCEDURE TRAY) ×3 IMPLANT
PENCIL BUTTON HOLSTER BLD 10FT (ELECTRODE) ×3 IMPLANT
SLEEVE SCD COMPRESS KNEE MED (MISCELLANEOUS) ×3 IMPLANT
SPONGE LAP 4X18 RFD (DISPOSABLE) ×5 IMPLANT
STRIP CLOSURE SKIN 1/2X4 (GAUZE/BANDAGES/DRESSINGS) ×2 IMPLANT
SUT ETHILON 2 0 FS 18 (SUTURE) IMPLANT
SUT MNCRL AB 4-0 PS2 18 (SUTURE) ×3 IMPLANT
SUT MON AB 5-0 PS2 18 (SUTURE) IMPLANT
SUT SILK 2 0 SH (SUTURE) IMPLANT
SUT VIC AB 2-0 SH 27 (SUTURE) ×6
SUT VIC AB 2-0 SH 27XBRD (SUTURE) ×1 IMPLANT
SUT VIC AB 3-0 SH 27 (SUTURE) ×6
SUT VIC AB 3-0 SH 27X BRD (SUTURE) ×1 IMPLANT
SUT VIC AB 5-0 PS2 18 (SUTURE) IMPLANT
SYR CONTROL 10ML LL (SYRINGE) ×5 IMPLANT
TOWEL GREEN STERILE FF (TOWEL DISPOSABLE) ×3 IMPLANT
TRAY FAXITRON CT DISP (TRAY / TRAY PROCEDURE) ×3 IMPLANT
TUBE CONNECTING 20'X1/4 (TUBING) ×1
TUBE CONNECTING 20X1/4 (TUBING) ×1 IMPLANT
YANKAUER SUCT BULB TIP NO VENT (SUCTIONS) ×2 IMPLANT

## 2019-01-15 NOTE — Progress Notes (Signed)
Emotional support during breast injections °

## 2019-01-15 NOTE — Transfer of Care (Signed)
Immediate Anesthesia Transfer of Care Note  Patient: Kathy Griffin  Procedure(s) Performed: LEFT BREAST LUMPECTOMY WITH RADIOACTIVE SEED AND LEFT AXILLARY NODE SEED GUIDED EXCISION AND LEFT AXILLARY SENTINEL LYMPH NODE BIOPSY WITH BLUE DYE INJECTION (Left Breast)  Patient Location: PACU  Anesthesia Type:General  Level of Consciousness: drowsy  Airway & Oxygen Therapy: Patient Spontanous Breathing and Patient connected to nasal cannula oxygen  Post-op Assessment: Report given to RN and Post -op Vital signs reviewed and stable  Post vital signs: Reviewed and stable  Last Vitals:  Vitals Value Taken Time  BP 112/63 01/15/19 1047  Temp    Pulse 58 01/15/19 1049  Resp 11 01/15/19 1049  SpO2 100 % 01/15/19 1049  Vitals shown include unvalidated device data.  Last Pain:  Vitals:   01/15/19 0711  TempSrc: Oral  PainSc: 0-No pain         Complications: No apparent anesthesia complications

## 2019-01-15 NOTE — Anesthesia Procedure Notes (Signed)
Anesthesia Regional Block: Pectoralis block   Pre-Anesthetic Checklist: ,, timeout performed, Correct Patient, Correct Site, Correct Laterality, Correct Procedure, Correct Position, site marked, Risks and benefits discussed,  Surgical consent,  Pre-op evaluation,  At surgeon's request and post-op pain management  Laterality: Left  Prep: chloraprep       Needles:  Injection technique: Single-shot  Needle Type: Echogenic Stimulator Needle     Needle Length: 9cm  Needle Gauge: 21     Additional Needles:   Procedures:,,,, ultrasound used (permanent image in chart),,,,  Narrative:  Start time: 01/15/2019 8:55 AM End time: 01/15/2019 9:01 AM Injection made incrementally with aspirations every 5 mL.  Performed by: Personally  Anesthesiologist: Effie Berkshire, MD  Additional Notes: Patient tolerated the procedure well. Local anesthetic introduced in an incremental fashion under minimal resistance after negative aspirations. No paresthesias were elicited. After completion of the procedure, no acute issues were identified and patient continued to be monitored by RN.

## 2019-01-15 NOTE — Anesthesia Procedure Notes (Signed)
Procedure Name: LMA Insertion Date/Time: 01/15/2019 9:36 AM Performed by: Lieutenant Diego, CRNA Pre-anesthesia Checklist: Patient identified, Emergency Drugs available, Suction available and Patient being monitored Patient Re-evaluated:Patient Re-evaluated prior to induction Oxygen Delivery Method: Circle system utilized Preoxygenation: Pre-oxygenation with 100% oxygen Induction Type: IV induction Ventilation: Mask ventilation without difficulty LMA: LMA inserted LMA Size: 4.0 Number of attempts: 1 Placement Confirmation: positive ETCO2 and breath sounds checked- equal and bilateral Tube secured with: Tape Dental Injury: Teeth and Oropharynx as per pre-operative assessment

## 2019-01-15 NOTE — Discharge Instructions (Addendum)
°Post Anesthesia Home Care Instructions ° °Activity: °Get plenty of rest for the remainder of the day. A responsible individual must stay with you for 24 hours following the procedure.  °For the next 24 hours, DO NOT: °-Drive a car °-Operate machinery °-Drink alcoholic beverages °-Take any medication unless instructed by your physician °-Make any legal decisions or sign important papers. ° °Meals: °Start with liquid foods such as gelatin or soup. Progress to regular foods as tolerated. Avoid greasy, spicy, heavy foods. If nausea and/or vomiting occur, drink only clear liquids until the nausea and/or vomiting subsides. Call your physician if vomiting continues. ° °Special Instructions/Symptoms: °Your throat may feel dry or sore from the anesthesia or the breathing tube placed in your throat during surgery. If this causes discomfort, gargle with warm salt water. The discomfort should disappear within 24 hours. ° °If you had a scopolamine patch placed behind your ear for the management of post- operative nausea and/or vomiting: ° °1. The medication in the patch is effective for 72 hours, after which it should be removed.  Wrap patch in a tissue and discard in the trash. Wash hands thoroughly with soap and water. °2. You may remove the patch earlier than 72 hours if you experience unpleasant side effects which may include dry mouth, dizziness or visual disturbances. °3. Avoid touching the patch. Wash your hands with soap and water after contact with the patch. °   ° ° ° °[facility] °Office Phone Number [phone] °POST OP INSTRUCTIONS °Take 400 mg of ibuprofen every 8 hours or 650 mg tylenol every 6 hours for next 72 hours then as needed. Use ice several times daily also. °Always review your discharge instruction sheet given to you by the facility where your surgery was performed. ° °IF YOU HAVE DISABILITY OR FAMILY LEAVE FORMS, YOU MUST BRING THEM TO THE OFFICE FOR PROCESSING.  DO NOT GIVE THEM TO  YOUR DOCTOR. ° °1. A prescription for pain medication may be given to you upon discharge.  Take your pain medication as prescribed, if needed.  If narcotic pain medicine is not needed, then you may take acetaminophen (Tylenol), naprosyn (Alleve) or ibuprofen (Advil) as needed. °2. Take your usually prescribed medications unless otherwise directed °3. If you need a refill on your pain medication, please contact your pharmacy.  They will contact our office to request authorization.  Prescriptions will not be filled after 5pm or on week-ends. °4. You should eat very light the first 24 hours after surgery, such as soup, crackers, pudding, etc.  Resume your normal diet the day after surgery. °5. Most patients will experience some swelling and bruising in the breast.  Ice packs and a good support bra will help.  Wear the breast binder provided or a sports bra for 72 hours day and night.  After that wear a sports bra during the day until you return to the office. Swelling and bruising can take several days to resolve.  °6. It is common to experience some constipation if taking pain medication after surgery.  Increasing fluid intake and taking a stool softener will usually help or prevent this problem from occurring.  A mild laxative (Milk of Magnesia or Miralax) should be taken according to package directions if there are no bowel movements after 48 hours. °7. Unless discharge instructions indicate otherwise, you may remove your bandages 48 hours after surgery and you may shower at that time.  You may have steri-strips (small skin tapes) in place directly over the   incision.  These strips should be left on the skin for 7-10 days and will come off on their own.  If your surgeon used skin glue on the incision, you may shower in 24 hours.  The glue will flake off over the next 2-3 weeks.  Any sutures or staples will be removed at the office during your follow-up visit. °8. ACTIVITIES:  You may resume regular daily activities  (gradually increasing) beginning the next day.  Wearing a good support bra or sports bra minimizes pain and swelling.  You may have sexual intercourse when it is comfortable. °a. You may drive when you no longer are taking prescription pain medication, you can comfortably wear a seatbelt, and you can safely maneuver your car and apply brakes. °b. RETURN TO WORK:  ______________________________________________________________________________________ °9. You should see your doctor in the office for a follow-up appointment approximately two weeks after your surgery.  Your doctor’s nurse will typically make your follow-up appointment when she calls you with your pathology report.  Expect your pathology report 3-4 business days after your surgery.  You may call to check if you do not hear from us after three days. °10. OTHER INSTRUCTIONS: _______________________________________________________________________________________________ _____________________________________________________________________________________________________________________________________ °_____________________________________________________________________________________________________________________________________ °_____________________________________________________________________________________________________________________________________ ° °WHEN TO CALL DR WAKEFIELD: °1. Fever over 101.0 °2. Nausea and/or vomiting. °3. Extreme swelling or bruising. °4. Continued bleeding from incision. °5. Increased pain, redness, or drainage from the incision. ° °The clinic staff is available to answer your questions during regular business hours.  Please don’t hesitate to call and ask to speak to one of the nurses for clinical concerns.  If you have a medical emergency, go to the nearest emergency room or call 911.  A surgeon from [facility] is always on call at the hospital. ° °For further questions, please visit  [email or web address] ° °

## 2019-01-15 NOTE — Progress Notes (Signed)
Assisted Dr. Hollis with left, ultrasound guided, pectoralis block. Side rails up, monitors on throughout procedure. See vital signs in flow sheet. Tolerated Procedure well. 

## 2019-01-15 NOTE — Interval H&P Note (Signed)
History and Physical Interval Note:  01/15/2019 8:19 AM  Kathy Griffin  has presented today for surgery, with the diagnosis of left breast cancer s/p primary chemo.  The various methods of treatment have been discussed with the patient and family. After consideration of risks, benefits and other options for treatment, the patient has consented to  Procedure(s): LEFT BREAST LUMPECTOMY WITH RADIOACTIVE SEED AND LEFT AXILLARY NODE SEED GUIDED EXCISION AND LEFT AXILLARY SENTINEL LYMPH NODE BIOPSY WITH BLUE DYE INJECTION (Left) as a surgical intervention.  The patient's history has been reviewed, patient examined, no change in status, stable for surgery.  I have reviewed the patient's chart and labs.  Questions were answered to the patient's satisfaction.     Rolm Bookbinder

## 2019-01-15 NOTE — Op Note (Signed)
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  Left breast radioactive seed guided lumpectomy 2.  Left axillary node radioactive seed guided excision 3.  Left axillary sentinel lymph node biopsy 4.  Injection of blue dye for sentinel node identification Surgeon: Dr. Serita Grammes Anesthesia: General with a pectoral block Specimens: 1.  Left breast tissue marked with paint 2.  Anterior margin of left breast lumpectomy marked short superior, long lateral, double deep 3.  Left axillary sentinel node also containing radioactive seed and clip 4.  Additional left axillary sentinel node Estimated blood loss: 10 cc Complications: None Drains: None Sponge and count was correct at completion Disposition to recovery in stable condition  Indications: This is a 63 year old female I saw previously who had a lymph node positive triple negative breast cancer.  [place]  She returns with what appears to be on an ultrasound is a modest response to the chemotherapy.  She has no clinically positive nodes.  We discussed all of her options and have elected to proceed with a seed guided lumpectomy and a targeted lymph node dissection of her left axilla.  Procedure: She first had the seeds placed at the breast center.  I had these available for my review in the operating room.  After for informed consent was obtained she first underwent a pectoral block and injection of technetium in the standard periareolar fashion.  She was then placed under general anesthesia without complication.  She was given antibiotics.  SCDs were in place.  She was then prepped and draped in the standard sterile surgical fashion.  A surgical timeout was then performed.  I first injected 3 cc of methylene blue saline mixture in the retroareolar position and massaged  this.  I then identified the seed in the upper pole of the left breast.  I was unable to make a periareolar incision as the seed was immediately adjacent to the skin.  I elected to make a curvilinear incision overlying the seed and what ever is left of the tumor.  I filtrated Marcaine and made an incision.  I then remove the seed and the surrounding tissue with an attempt to get a clear margin.  Hemostasis was observed.  I placed some clips.  I did a mammogram in the operating room confirming removal of the clip and the seed was sent separately as it was very anterior.  The calcifications on the 3D images went to the anterior margin so I did remove additional anterior margin this is just the skin now.  I eventually mobilized the breast tissue and closed this with 2-0 Vicryl, 3-0 Vicryl, and 4-0 Monocryl.  Glue and Steri-Strips were applied.  I then made an incision below the axillary hairline after infiltration with Marcaine.  I carried this through the axillary fascia.  There was a enlarged left axillary lymph node that was blue, radioactive, and contained the radioactive seed.  I excised this.  I confirmed removal of the radioactive seed and the prior clip with mammography.  I then was able to identify an additional blue lymph node and this was sent as an additional sentinel lymph node.  There was no more radioactivity and no blue dye present in the axilla.  Hemostasis was observed.  I did place some Arista in the axilla.  I closed this with 2-0 Vicryl, 3-0 Vicryl, and 4-0 Monocryl.  Glue and  Steri-Strips were applied.  She tolerated this well was extubated and transferred to the recovery room stable.

## 2019-01-15 NOTE — H&P (View-Only) (Signed)
  60 yof I saw previously for a tn left bc. she has no fh. she is otherwise healthy. she noted a left breast mass. mm shows dense breasts and a mass that on US shows a 2.1 cm mass and a 2.8 cm axillary node. she does not have discharge. she had core biospy of node that is positive and biopsy of breast mass with clip placed that is a grade III TN IDC with DCIS and Ki is 60%. she had initial mri that showed a 2.4 cm upper inner quadrant mass and a node that is 3.1 cm with possibly a couple other nodes. 3 mm left im node. she has underonge AC-T in [place] and is on "repurposed meds" now. she does not note the mass. she completed chemo in early april. she has a pet scan per request that shows a hypermetabolic left ax node with no other disease noted. she is here with her husband to discuss surgical options   Past Surgical History  Breast Biopsy  Left. Knee Surgery  Left. Oral Surgery   Allergies  No Known Allergies    Medication History Medications Reconciled Albuterol Sulfate (108 (90 Base)MCG/ACT Aero Pow Br Act, Inhalation) Active. Ascorbic Acid (1000MG  Tablet, Oral) Active. B Complex (Oral) Active. Omega 3 (1000MG  Capsule, Oral) Active. Armour Thyroid (90MG  Tablet, Oral) Active. Multiple Vitamin (1 (one) Oral) Active.  Social History  Alcohol use  Moderate alcohol use. Caffeine use  Coffee. No drug use  Tobacco use  Never smoker.  Family History  Alcohol Abuse  Brother, Father. Breast Cancer  Mother. Cerebrovascular Accident  Father. Colon Polyps  Father. Heart Disease  Father. Melanoma  Father.  Vitals  Weight: 129 lb Height: 67in Body Surface Area: 1.68 m Body Mass Index: 20.2 kg/m  Temp.: 83F (Oral)  Pulse: 67 (Regular)  BP: 102/62(Sitting, Left Arm, Standard) Physical Exam  General Mental Status-Alert. Head and Neck Trachea-midline. Thyroid Gland Characteristics - normal size and consistency. Eye Sclera/Conjunctiva -  Bilateral-No scleral icterus. Chest and Lung Exam Chest and lung exam reveals -quiet, even and easy respiratory effort with no use of accessory muscles. Breast Nipples-No Discharge. Breast - Bilateral-Symmetric. Breast Lump-No Palpable Breast Mass. Neurologic Neurologic evaluation reveals -alert and oriented x 3 with no impairment of recent or remote memory. Lymphatic Head & Neck General Head & Neck Lymphatics: Bilateral - Description - Normal. Axillary General Axillary Region: Bilateral - Description - Normal. Note: no [place] adenopathy   Assessment & Plan  BREAST CANCER OF UPPER-OUTER QUADRANT OF LEFT FEMALE BREAST (C50.412) Story: Reassess tumor with imaging, plan for left breast seed guided lumpectomy, left targeted node dissection We discussed the staging and pathophysiology of breast cancer again. We discussed all of the different options for treatment for breast cancer including surgery, chemotherapy, radiation therapy, Herceptin, and antiestrogen therapy. we discussed surgery including lumpectomy vs mastectomy. we discussed types of mastectomy and reconstruction. I think she should be good candidate for lumpectomy now and this is her desire. we discussed positive margins. will get [place] as mri is taking toolong now though better to assess. I think with exam reasonable to do lumpectomy. we discussed nodal status again. dont know what significance of hypermetabolic node is on pet- it is normal size and clinically negative. I discussed TAD with them. we agreed to not do intraop pathology and await final results. I told them may recommend returning for alnd but they would like to discuss prior to that. I recommended proceeding with surgery

## 2019-01-15 NOTE — H&P (Signed)
  33 yof I saw previously for a tn left bc. she has no fh. she is otherwise healthy. she noted a left breast mass. mm shows dense breasts and a mass that on US shows a 2.1 cm mass and a 2.8 cm axillary node. she does not have discharge. she had core biospy of node that is positive and biopsy of breast mass with clip placed that is a grade III TN IDC with DCIS and Ki is 60%. she had initial mri that showed a 2.4 cm upper inner quadrant mass and a node that is 3.1 cm with possibly a couple other nodes. 3 mm left im node. she has underonge AC-T in [place] and is on "repurposed meds" now. she does not note the mass. she completed chemo in early april. she has a pet scan per request that shows a hypermetabolic left ax node with no other disease noted. she is here with her husband to discuss surgical options   Past Surgical History  Breast Biopsy  Left. Knee Surgery  Left. Oral Surgery   Allergies  No Known Allergies    Medication History Medications Reconciled Albuterol Sulfate (108 (90 Base)MCG/ACT Aero Pow Br Act, Inhalation) Active. Ascorbic Acid (1000MG  Tablet, Oral) Active. B Complex (Oral) Active. Omega 3 (1000MG  Capsule, Oral) Active. Armour Thyroid (90MG  Tablet, Oral) Active. Multiple Vitamin (1 (one) Oral) Active.  Social History  Alcohol use  Moderate alcohol use. Caffeine use  Coffee. No drug use  Tobacco use  Never smoker.  Family History  Alcohol Abuse  Brother, Father. Breast Cancer  Mother. Cerebrovascular Accident  Father. Colon Polyps  Father. Heart Disease  Father. Melanoma  Father.  Vitals  Weight: 129 lb Height: 67in Body Surface Area: 1.68 m Body Mass Index: 20.2 kg/m  Temp.: 67F (Oral)  Pulse: 67 (Regular)  BP: 102/62(Sitting, Left Arm, Standard) Physical Exam  General Mental Status-Alert. Head and Neck Trachea-midline. Thyroid Gland Characteristics - normal size and consistency. Eye Sclera/Conjunctiva -  Bilateral-No scleral icterus. Chest and Lung Exam Chest and lung exam reveals -quiet, even and easy respiratory effort with no use of accessory muscles. Breast Nipples-No Discharge. Breast - Bilateral-Symmetric. Breast Lump-No Palpable Breast Mass. Neurologic Neurologic evaluation reveals -alert and oriented x 3 with no impairment of recent or remote memory. Lymphatic Head & Neck General Head & Neck Lymphatics: Bilateral - Description - Normal. Axillary General Axillary Region: Bilateral - Description - Normal. Note: no [place] adenopathy   Assessment & Plan  BREAST CANCER OF UPPER-OUTER QUADRANT OF LEFT FEMALE BREAST (C50.412) Story: Reassess tumor with imaging, plan for left breast seed guided lumpectomy, left targeted node dissection We discussed the staging and pathophysiology of breast cancer again. We discussed all of the different options for treatment for breast cancer including surgery, chemotherapy, radiation therapy, Herceptin, and antiestrogen therapy. we discussed surgery including lumpectomy vs mastectomy. we discussed types of mastectomy and reconstruction. I think she should be good candidate for lumpectomy now and this is her desire. we discussed positive margins. will get [place] as mri is taking toolong now though better to assess. I think with exam reasonable to do lumpectomy. we discussed nodal status again. dont know what significance of hypermetabolic node is on pet- it is normal size and clinically negative. I discussed TAD with them. we agreed to not do intraop pathology and await final results. I told them may recommend returning for alnd but they would like to discuss prior to that. I recommended proceeding with surgery

## 2019-01-15 NOTE — Anesthesia Preprocedure Evaluation (Addendum)
Anesthesia Evaluation  Patient identified by MRN, date of birth, ID band Patient awake    Reviewed: Allergy & Precautions, NPO status , Patient's Chart, lab work & pertinent test results  Airway Mallampati: I  TM Distance: >3 FB Neck ROM: Full    Dental  (+) Teeth Intact, Dental Advisory Given   Pulmonary asthma ,    breath sounds clear to auscultation       Cardiovascular negative cardio ROS   Rhythm:Regular Rate:Normal     Neuro/Psych Anxiety negative neurological ROS     GI/Hepatic negative GI ROS, Neg liver ROS,   Endo/Other  diabetesHypothyroidism   Renal/GU negative Renal ROS     Musculoskeletal  (+) Arthritis ,   Abdominal Normal abdominal exam  (+)   Peds  Hematology   Anesthesia Other Findings   Reproductive/Obstetrics                            Anesthesia Physical Anesthesia Plan  ASA: II  Anesthesia Plan: General   Post-op Pain Management: GA combined w/ Regional for post-op pain   Induction: Intravenous  PONV Risk Score and Plan: 4 or greater and Ondansetron, Dexamethasone, Midazolam, Scopolamine patch - Pre-op and Treatment may vary due to age or medical condition  Airway Management Planned: LMA  Additional Equipment: None  Intra-op Plan:   Post-operative Plan: Extubation in OR  Informed Consent: I have reviewed the patients History and Physical, chart, labs and discussed the procedure including the risks, benefits and alternatives for the proposed anesthesia with the patient or authorized representative who has indicated his/her understanding and acceptance.     Dental advisory given  Plan Discussed with: CRNA  Anesthesia Plan Comments:        Anesthesia Quick Evaluation

## 2019-01-16 ENCOUNTER — Encounter (HOSPITAL_BASED_OUTPATIENT_CLINIC_OR_DEPARTMENT_OTHER): Payer: Self-pay | Admitting: General Surgery

## 2019-01-16 ENCOUNTER — Encounter: Payer: Self-pay | Admitting: *Deleted

## 2019-01-16 NOTE — Anesthesia Postprocedure Evaluation (Signed)
Anesthesia Post Note  Patient: Kathy Griffin  Procedure(s) Performed: LEFT BREAST LUMPECTOMY WITH RADIOACTIVE SEED AND LEFT AXILLARY NODE SEED GUIDED EXCISION AND LEFT AXILLARY SENTINEL LYMPH NODE BIOPSY WITH BLUE DYE INJECTION (Left Breast)     Patient location during evaluation: PACU Anesthesia Type: General Level of consciousness: awake and alert Pain management: pain level controlled Vital Signs Assessment: post-procedure vital signs reviewed and stable Respiratory status: spontaneous breathing, nonlabored ventilation, respiratory function stable and patient connected to nasal cannula oxygen Cardiovascular status: blood pressure returned to baseline and stable Postop Assessment: no apparent nausea or vomiting Anesthetic complications: no    Last Vitals:  Vitals:   01/15/19 1130 01/15/19 1215  BP: 128/78 123/77  Pulse: 67 61  Resp: 20 20  Temp:  (!) 36.1 C  SpO2: 97% 97%    Last Pain:  Vitals:   01/16/19 0935  TempSrc:   PainSc: 0-No pain                 Effie Berkshire

## 2019-01-20 ENCOUNTER — Telehealth: Payer: Self-pay | Admitting: Adult Health

## 2019-01-20 NOTE — Telephone Encounter (Signed)
I left a message regarding flush

## 2019-01-23 ENCOUNTER — Other Ambulatory Visit: Payer: Self-pay | Admitting: General Surgery

## 2019-01-29 ENCOUNTER — Other Ambulatory Visit: Payer: Self-pay

## 2019-01-29 ENCOUNTER — Encounter (HOSPITAL_BASED_OUTPATIENT_CLINIC_OR_DEPARTMENT_OTHER): Payer: Self-pay | Admitting: *Deleted

## 2019-01-29 ENCOUNTER — Inpatient Hospital Stay: Payer: 59 | Attending: Hematology and Oncology

## 2019-01-29 DIAGNOSIS — Z452 Encounter for adjustment and management of vascular access device: Secondary | ICD-10-CM | POA: Diagnosis not present

## 2019-01-29 DIAGNOSIS — C50212 Malignant neoplasm of upper-inner quadrant of left female breast: Secondary | ICD-10-CM | POA: Insufficient documentation

## 2019-01-29 DIAGNOSIS — Z170 Estrogen receptor positive status [ER+]: Secondary | ICD-10-CM | POA: Diagnosis not present

## 2019-01-29 DIAGNOSIS — Z95828 Presence of other vascular implants and grafts: Secondary | ICD-10-CM

## 2019-01-29 MED ORDER — SODIUM CHLORIDE 0.9% FLUSH
10.0000 mL | INTRAVENOUS | Status: DC | PRN
  Administered 2019-01-29: 10 mL via INTRAVENOUS
  Filled 2019-01-29: qty 10

## 2019-01-29 MED ORDER — HEPARIN SOD (PORK) LOCK FLUSH 100 UNIT/ML IV SOLN
500.0000 [IU] | Freq: Once | INTRAVENOUS | Status: AC
  Administered 2019-01-29: 10:00:00 500 [IU] via INTRAVENOUS
  Filled 2019-01-29: qty 5

## 2019-02-03 ENCOUNTER — Other Ambulatory Visit (HOSPITAL_COMMUNITY)
Admission: RE | Admit: 2019-02-03 | Discharge: 2019-02-03 | Disposition: A | Discharge status: Home / Self Care | Payer: 59 | Source: Ambulatory Visit | Attending: General Surgery | Admitting: General Surgery

## 2019-02-03 DIAGNOSIS — Z1159 Encounter for screening for other viral diseases: Secondary | ICD-10-CM | POA: Diagnosis not present

## 2019-02-03 DIAGNOSIS — Z01812 Encounter for preprocedural laboratory examination: Secondary | ICD-10-CM | POA: Diagnosis not present

## 2019-02-04 LAB — SARS CORONAVIRUS 2 (TAT 6-24 HRS): SARS Coronavirus 2: NEGATIVE

## 2019-02-06 ENCOUNTER — Ambulatory Visit (HOSPITAL_BASED_OUTPATIENT_CLINIC_OR_DEPARTMENT_OTHER): Payer: 59 | Admitting: Anesthesiology

## 2019-02-06 ENCOUNTER — Encounter (HOSPITAL_BASED_OUTPATIENT_CLINIC_OR_DEPARTMENT_OTHER)
Admission: RE | Disposition: A | Discharge status: Home / Self Care | Payer: Self-pay | Source: Home / Self Care | Attending: General Surgery

## 2019-02-06 ENCOUNTER — Other Ambulatory Visit: Payer: Self-pay

## 2019-02-06 ENCOUNTER — Ambulatory Visit (HOSPITAL_BASED_OUTPATIENT_CLINIC_OR_DEPARTMENT_OTHER)
Admission: RE | Admit: 2019-02-06 | Discharge: 2019-02-06 | Disposition: A | Discharge status: Home / Self Care | Payer: 59 | Attending: General Surgery | Admitting: General Surgery

## 2019-02-06 ENCOUNTER — Encounter (HOSPITAL_BASED_OUTPATIENT_CLINIC_OR_DEPARTMENT_OTHER): Payer: Self-pay | Admitting: *Deleted

## 2019-02-06 DIAGNOSIS — E119 Type 2 diabetes mellitus without complications: Secondary | ICD-10-CM | POA: Diagnosis not present

## 2019-02-06 DIAGNOSIS — F419 Anxiety disorder, unspecified: Secondary | ICD-10-CM | POA: Insufficient documentation

## 2019-02-06 DIAGNOSIS — Z803 Family history of malignant neoplasm of breast: Secondary | ICD-10-CM | POA: Insufficient documentation

## 2019-02-06 DIAGNOSIS — E039 Hypothyroidism, unspecified: Secondary | ICD-10-CM | POA: Insufficient documentation

## 2019-02-06 DIAGNOSIS — J45909 Unspecified asthma, uncomplicated: Secondary | ICD-10-CM | POA: Diagnosis not present

## 2019-02-06 DIAGNOSIS — Z79899 Other long term (current) drug therapy: Secondary | ICD-10-CM | POA: Diagnosis not present

## 2019-02-06 DIAGNOSIS — C50412 Malignant neoplasm of upper-outer quadrant of left female breast: Secondary | ICD-10-CM | POA: Insufficient documentation

## 2019-02-06 DIAGNOSIS — Z171 Estrogen receptor negative status [ER-]: Secondary | ICD-10-CM | POA: Insufficient documentation

## 2019-02-06 DIAGNOSIS — M1712 Unilateral primary osteoarthritis, left knee: Secondary | ICD-10-CM | POA: Diagnosis not present

## 2019-02-06 HISTORY — PX: BREAST LUMPECTOMY: SHX2

## 2019-02-06 SURGERY — BREAST LUMPECTOMY
Anesthesia: General | Site: Breast | Laterality: Left

## 2019-02-06 MED ORDER — ENSURE PRE-SURGERY PO LIQD: 296.0000 mL | Freq: Once | ORAL | Status: DC

## 2019-02-06 MED ORDER — EPHEDRINE 5 MG/ML INJ
INTRAVENOUS | Status: AC
  Filled 2019-02-06: qty 10

## 2019-02-06 MED ORDER — GABAPENTIN 100 MG PO CAPS
ORAL_CAPSULE | ORAL | Status: AC
  Filled 2019-02-06: qty 1

## 2019-02-06 MED ORDER — GABAPENTIN 100 MG PO CAPS
100.0000 mg | ORAL_CAPSULE | ORAL | Status: AC
  Administered 2019-02-06: 100 mg via ORAL

## 2019-02-06 MED ORDER — FENTANYL CITRATE (PF) 250 MCG/5ML IJ SOLN
INTRAMUSCULAR | Status: DC | PRN
  Administered 2019-02-06: 25 ug via INTRAVENOUS

## 2019-02-06 MED ORDER — MEPERIDINE HCL 25 MG/ML IJ SOLN: 6.2500 mg | INTRAMUSCULAR | Status: DC | PRN

## 2019-02-06 MED ORDER — LIDOCAINE 2% (20 MG/ML) 5 ML SYRINGE
INTRAMUSCULAR | Status: DC | PRN
  Administered 2019-02-06: 100 mg via INTRAVENOUS

## 2019-02-06 MED ORDER — FENTANYL CITRATE (PF) 100 MCG/2ML IJ SOLN: 50.0000 ug | INTRAMUSCULAR | Status: DC | PRN

## 2019-02-06 MED ORDER — METOCLOPRAMIDE HCL 5 MG/ML IJ SOLN: 10.0000 mg | Freq: Once | INTRAMUSCULAR | Status: DC | PRN

## 2019-02-06 MED ORDER — MIDAZOLAM HCL 5 MG/5ML IJ SOLN
INTRAMUSCULAR | Status: DC | PRN
  Administered 2019-02-06: 2 mg via INTRAVENOUS

## 2019-02-06 MED ORDER — LIDOCAINE 2% (20 MG/ML) 5 ML SYRINGE
INTRAMUSCULAR | Status: AC
  Filled 2019-02-06: qty 5

## 2019-02-06 MED ORDER — PHENYLEPHRINE 40 MCG/ML (10ML) SYRINGE FOR IV PUSH (FOR BLOOD PRESSURE SUPPORT)
PREFILLED_SYRINGE | INTRAVENOUS | Status: AC
  Filled 2019-02-06: qty 10

## 2019-02-06 MED ORDER — BUPIVACAINE HCL (PF) 0.25 % IJ SOLN
INTRAMUSCULAR | Status: AC
  Filled 2019-02-06: qty 30

## 2019-02-06 MED ORDER — MIDAZOLAM HCL 2 MG/2ML IJ SOLN
INTRAMUSCULAR | Status: AC
  Filled 2019-02-06: qty 2

## 2019-02-06 MED ORDER — SCOPOLAMINE 1 MG/3DAYS TD PT72: 1.0000 | MEDICATED_PATCH | Freq: Once | TRANSDERMAL | Status: DC

## 2019-02-06 MED ORDER — SUCCINYLCHOLINE CHLORIDE 200 MG/10ML IV SOSY
PREFILLED_SYRINGE | INTRAVENOUS | Status: AC
  Filled 2019-02-06: qty 10

## 2019-02-06 MED ORDER — ONDANSETRON HCL 4 MG/2ML IJ SOLN
INTRAMUSCULAR | Status: AC
  Filled 2019-02-06: qty 2

## 2019-02-06 MED ORDER — FENTANYL CITRATE (PF) 100 MCG/2ML IJ SOLN
INTRAMUSCULAR | Status: AC
  Filled 2019-02-06: qty 2

## 2019-02-06 MED ORDER — EPHEDRINE SULFATE-NACL 50-0.9 MG/10ML-% IV SOSY
PREFILLED_SYRINGE | INTRAVENOUS | Status: DC | PRN
  Administered 2019-02-06 (×2): 10 mg via INTRAVENOUS

## 2019-02-06 MED ORDER — PROPOFOL 10 MG/ML IV BOLUS
INTRAVENOUS | Status: DC | PRN
  Administered 2019-02-06: 170 mg via INTRAVENOUS

## 2019-02-06 MED ORDER — DEXAMETHASONE SODIUM PHOSPHATE 10 MG/ML IJ SOLN
INTRAMUSCULAR | Status: AC
  Filled 2019-02-06: qty 1

## 2019-02-06 MED ORDER — LACTATED RINGERS IV SOLN
INTRAVENOUS | Status: DC
  Administered 2019-02-06: 07:00:00 via INTRAVENOUS

## 2019-02-06 MED ORDER — BUPIVACAINE HCL (PF) 0.25 % IJ SOLN
INTRAMUSCULAR | Status: DC | PRN
  Administered 2019-02-06: 20 mL

## 2019-02-06 MED ORDER — CEFAZOLIN SODIUM-DEXTROSE 2-4 GM/100ML-% IV SOLN: 2.0000 g | INTRAVENOUS | Status: DC

## 2019-02-06 MED ORDER — FENTANYL CITRATE (PF) 100 MCG/2ML IJ SOLN: 25.0000 ug | INTRAMUSCULAR | Status: DC | PRN

## 2019-02-06 MED ORDER — MIDAZOLAM HCL 2 MG/2ML IJ SOLN: 1.0000 mg | INTRAMUSCULAR | Status: DC | PRN

## 2019-02-06 MED ORDER — ACETAMINOPHEN 500 MG PO TABS: 1000.0000 mg | ORAL_TABLET | ORAL | Status: DC

## 2019-02-06 MED ORDER — KETOROLAC TROMETHAMINE 15 MG/ML IJ SOLN
15.0000 mg | INTRAMUSCULAR | Status: AC
  Administered 2019-02-06: 15 mg via INTRAVENOUS

## 2019-02-06 MED ORDER — ONDANSETRON HCL 4 MG/2ML IJ SOLN
INTRAMUSCULAR | Status: DC | PRN
  Administered 2019-02-06: 4 mg via INTRAVENOUS

## 2019-02-06 MED ORDER — KETOROLAC TROMETHAMINE 15 MG/ML IJ SOLN
INTRAMUSCULAR | Status: AC
  Filled 2019-02-06: qty 1

## 2019-02-06 MED ORDER — PROPOFOL 10 MG/ML IV BOLUS
INTRAVENOUS | Status: AC
  Filled 2019-02-06: qty 20

## 2019-02-06 MED ORDER — CEFAZOLIN SODIUM-DEXTROSE 2-4 GM/100ML-% IV SOLN
INTRAVENOUS | Status: AC
  Filled 2019-02-06: qty 100

## 2019-02-06 MED ORDER — DEXAMETHASONE SODIUM PHOSPHATE 10 MG/ML IJ SOLN
INTRAMUSCULAR | Status: DC | PRN
  Administered 2019-02-06: 5 mg via INTRAVENOUS

## 2019-02-06 MED ORDER — ACETAMINOPHEN 500 MG PO TABS
ORAL_TABLET | ORAL | Status: AC
  Filled 2019-02-06: qty 2

## 2019-02-06 MED ORDER — LACTATED RINGERS IV SOLN: INTRAVENOUS | Status: DC

## 2019-02-06 SURGICAL SUPPLY — 66 items
ADH SKN CLS APL DERMABOND .7 (GAUZE/BANDAGES/DRESSINGS) ×1
APL PRP STRL LF DISP 70% ISPRP (MISCELLANEOUS) ×1
APPLIER CLIP 9.375 MED OPEN (MISCELLANEOUS) ×3
APR CLP MED 9.3 20 MLT OPN (MISCELLANEOUS) ×1
BINDER BREAST LRG (GAUZE/BANDAGES/DRESSINGS) IMPLANT
BINDER BREAST MEDIUM (GAUZE/BANDAGES/DRESSINGS) ×2 IMPLANT
BINDER BREAST XLRG (GAUZE/BANDAGES/DRESSINGS) IMPLANT
BINDER BREAST XXLRG (GAUZE/BANDAGES/DRESSINGS) IMPLANT
BLADE SURG 15 STRL LF DISP TIS (BLADE) ×1 IMPLANT
BLADE SURG 15 STRL SS (BLADE) ×3
CANISTER SUCT 1200ML W/VALVE (MISCELLANEOUS) ×2 IMPLANT
CHLORAPREP W/TINT 26 (MISCELLANEOUS) ×3 IMPLANT
CLIP APPLIE 9.375 MED OPEN (MISCELLANEOUS) IMPLANT
CLIP VESOCCLUDE SM WIDE 6/CT (CLIP) IMPLANT
CLOSURE WOUND 1/2 X4 (GAUZE/BANDAGES/DRESSINGS) ×1
COVER BACK TABLE REUSABLE LG (DRAPES) ×3 IMPLANT
COVER MAYO STAND REUSABLE (DRAPES) ×3 IMPLANT
COVER WAND RF STERILE (DRAPES) IMPLANT
DECANTER SPIKE VIAL GLASS SM (MISCELLANEOUS) IMPLANT
DERMABOND ADVANCED (GAUZE/BANDAGES/DRESSINGS) ×2
DERMABOND ADVANCED .7 DNX12 (GAUZE/BANDAGES/DRESSINGS) IMPLANT
DRAPE LAPAROSCOPIC ABDOMINAL (DRAPES) ×1 IMPLANT
DRAPE LAPAROTOMY TRNSV 102X78 (DRAPES) ×2 IMPLANT
DRAPE UTILITY XL STRL (DRAPES) ×3 IMPLANT
DRSG TEGADERM 4X4.75 (GAUZE/BANDAGES/DRESSINGS) ×1 IMPLANT
ELECT BLADE 4.0 EZ CLEAN MEGAD (MISCELLANEOUS)
ELECT COATED BLADE 2.86 ST (ELECTRODE) ×3 IMPLANT
ELECT REM PT RETURN 9FT ADLT (ELECTROSURGICAL) ×3
ELECTRODE BLDE 4.0 EZ CLN MEGD (MISCELLANEOUS) IMPLANT
ELECTRODE REM PT RTRN 9FT ADLT (ELECTROSURGICAL) ×1 IMPLANT
GAUZE SPONGE 4X4 12PLY STRL LF (GAUZE/BANDAGES/DRESSINGS) ×1 IMPLANT
GLOVE BIO SURGEON STRL SZ7 (GLOVE) ×3 IMPLANT
GLOVE BIOGEL PI IND STRL 6.5 (GLOVE) IMPLANT
GLOVE BIOGEL PI IND STRL 7.5 (GLOVE) ×1 IMPLANT
GLOVE BIOGEL PI INDICATOR 6.5 (GLOVE) ×2
GLOVE BIOGEL PI INDICATOR 7.5 (GLOVE) ×2
GLOVE SURG SS PI 6.5 STRL IVOR (GLOVE) ×2 IMPLANT
GOWN STRL REUS W/ TWL LRG LVL3 (GOWN DISPOSABLE) ×3 IMPLANT
GOWN STRL REUS W/TWL LRG LVL3 (GOWN DISPOSABLE) ×6
HEMOSTAT ARISTA ABSORB 3G PWDR (HEMOSTASIS) IMPLANT
ILLUMINATOR WAVEGUIDE N/F (MISCELLANEOUS) IMPLANT
KIT MARKER MARGIN INK (KITS) ×3 IMPLANT
LIGHT WAVEGUIDE WIDE FLAT (MISCELLANEOUS) IMPLANT
NDL HYPO 25X1 1.5 SAFETY (NEEDLE) ×1 IMPLANT
NEEDLE HYPO 25X1 1.5 SAFETY (NEEDLE) ×3 IMPLANT
NS IRRIG 1000ML POUR BTL (IV SOLUTION) IMPLANT
PACK BASIN DAY SURGERY FS (CUSTOM PROCEDURE TRAY) ×3 IMPLANT
PENCIL BUTTON HOLSTER BLD 10FT (ELECTRODE) ×3 IMPLANT
SLEEVE SCD COMPRESS KNEE MED (MISCELLANEOUS) ×3 IMPLANT
SPONGE LAP 4X18 RFD (DISPOSABLE) ×3 IMPLANT
STRIP CLOSURE SKIN 1/2X4 (GAUZE/BANDAGES/DRESSINGS) ×1 IMPLANT
SUT MNCRL AB 4-0 PS2 18 (SUTURE) ×2 IMPLANT
SUT MON AB 5-0 PS2 18 (SUTURE) IMPLANT
SUT SILK 2 0 SH (SUTURE) ×1 IMPLANT
SUT VIC AB 2-0 SH 27 (SUTURE) ×6
SUT VIC AB 2-0 SH 27XBRD (SUTURE) ×1 IMPLANT
SUT VIC AB 3-0 SH 27 (SUTURE) ×3
SUT VIC AB 3-0 SH 27X BRD (SUTURE) ×1 IMPLANT
SUT VIC AB 5-0 PS2 18 (SUTURE) IMPLANT
SUT VICRYL AB 3 0 TIES (SUTURE) IMPLANT
SYR CONTROL 10ML LL (SYRINGE) ×3 IMPLANT
TOWEL GREEN STERILE FF (TOWEL DISPOSABLE) ×3 IMPLANT
TRAY FAXITRON CT DISP (TRAY / TRAY PROCEDURE) IMPLANT
TUBE CONNECTING 20'X1/4 (TUBING) ×1
TUBE CONNECTING 20X1/4 (TUBING) ×1 IMPLANT
YANKAUER SUCT BULB TIP NO VENT (SUCTIONS) ×2 IMPLANT

## 2019-02-06 NOTE — Anesthesia Procedure Notes (Signed)
Procedure Name: LMA Insertion Date/Time: 02/06/2019 7:36 AM Performed by: Myna Bright, CRNA Pre-anesthesia Checklist: Patient identified, Emergency Drugs available, Suction available and Patient being monitored Patient Re-evaluated:Patient Re-evaluated prior to induction Oxygen Delivery Method: Circle system utilized Preoxygenation: Pre-oxygenation with 100% oxygen Induction Type: IV induction Ventilation: Mask ventilation without difficulty LMA: LMA inserted LMA Size: 4.0 Number of attempts: 1 Placement Confirmation: positive ETCO2 and breath sounds checked- equal and bilateral Tube secured with: Tape Dental Injury: Teeth and Oropharynx as per pre-operative assessment

## 2019-02-06 NOTE — Op Note (Signed)
Preoperative diagnosis: Triple negative left breast cancer status post primary chemotherapy Postoperative diagnosis: Same as above Procedure: Reexcision left breast lumpectomy Surgeon: Dr. Serita Grammes Anesthesia: General Estimated blood loss: Minimal Specimens: Left breast medial margin marked with paint Complications: None Drains: None Sponge and needle count was correct at completion Disposition to recovery in stable condition  Indications: This is a 63 year old female who has a left breast triple negative cancer.  She is undergone primary chemotherapy of which I do not have records available.  [place]  She then return for surgery.  I did a lumpectomy and a targeted lymph node dissection.  She had a 3 mm residual breast cancer that was positive at a medial margin.  She also had 2 of 2+ lymph nodes.  We have discussed the options.  I recommended to her and axillary lymph node dissection as well as excision of the medial margin.  She is elected to proceed with excision of the medial margin alone.  I have also mentioned her several times that she will need radiotherapy to reduce her risk of recurrence is much as possible.  Procedure: After informed consent was obtained the patient was taken to the operating room.  She was given antibiotics.  SCDs were in place.  She was placed under general anesthesia without complication.  She was prepped and draped in the standard sterile surgical fashion.  A surgical timeout was then performed.  I filtrated Marcaine throughout her upper breast around her old incision.  I then reentered her old incision.  I released all my sutures closing the breast tissue.  I then identified the medial margin and excise this.  I marked this with paint.  I then obtained hemostasis.  I then closed the breast tissue with 2-0 Vicryl.  The dermis was closed with 3-0 Vicryl and the skin with 4-0 Monocryl.  Glue and Steri-Strips were applied.  She tolerated this well  was extubated and transferred to recovery stable.

## 2019-02-06 NOTE — Anesthesia Preprocedure Evaluation (Signed)
Anesthesia Evaluation  Patient identified by MRN, date of birth, ID band Patient awake    Reviewed: Allergy & Precautions, NPO status , Patient's Chart, lab work & pertinent test results  Airway Mallampati: I  TM Distance: >3 FB Neck ROM: Full    Dental  (+) Teeth Intact, Dental Advisory Given   Pulmonary asthma ,    breath sounds clear to auscultation       Cardiovascular negative cardio ROS   Rhythm:Regular Rate:Normal     Neuro/Psych Anxiety negative neurological ROS     GI/Hepatic negative GI ROS, Neg liver ROS,   Endo/Other  diabetesHypothyroidism   Renal/GU negative Renal ROS     Musculoskeletal  (+) Arthritis ,   Abdominal Normal abdominal exam  (+)   Peds  Hematology   Anesthesia Other Findings   Reproductive/Obstetrics                             Anesthesia Physical  Anesthesia Plan  ASA: II  Anesthesia Plan: General   Post-op Pain Management:    Induction: Intravenous  PONV Risk Score and Plan: 4 or greater and Ondansetron, Dexamethasone, Midazolam, Scopolamine patch - Pre-op and Treatment may vary due to age or medical condition  Airway Management Planned: LMA  Additional Equipment: None  Intra-op Plan:   Post-operative Plan:   Informed Consent: I have reviewed the patients History and Physical, chart, labs and discussed the procedure including the risks, benefits and alternatives for the proposed anesthesia with the patient or authorized representative who has indicated his/her understanding and acceptance.     Dental advisory given  Plan Discussed with: CRNA  Anesthesia Plan Comments:         Anesthesia Quick Evaluation

## 2019-02-06 NOTE — Discharge Instructions (Signed)
[facility] °Office Phone Number [phone] ° °POST OP INSTRUCTIONS °Take 400 mg of ibuprofen every 8 hours or 650 mg tylenol every 6 hours for next 72 hours then as needed. Use ice several times daily also. °Always review your discharge instruction sheet given to you by the facility where your surgery was performed. ° °IF YOU HAVE DISABILITY OR FAMILY LEAVE FORMS, YOU MUST BRING THEM TO THE OFFICE FOR PROCESSING.  DO NOT GIVE THEM TO YOUR DOCTOR. ° °1. A prescription for pain medication may be given to you upon discharge.  Take your pain medication as prescribed, if needed.  If narcotic pain medicine is not needed, then you may take acetaminophen (Tylenol), naprosyn (Alleve) or ibuprofen (Advil) as needed. °2. Take your usually prescribed medications unless otherwise directed °3. If you need a refill on your pain medication, please contact your pharmacy.  They will contact our office to request authorization.  Prescriptions will not be filled after 5pm or on week-ends. °4. You should eat very light the first 24 hours after surgery, such as soup, crackers, pudding, etc.  Resume your normal diet the day after surgery. °5. Most patients will experience some swelling and bruising in the breast.  Ice packs and a good support bra will help.  Wear the breast binder provided or a sports bra for 72 hours day and night.  After that wear a sports bra during the day until you return to the office. Swelling and bruising can take several days to resolve.  °6. It is common to experience some constipation if taking pain medication after surgery.  Increasing fluid intake and taking a stool softener will usually help or prevent this problem from occurring.  A mild laxative (Milk of Magnesia or Miralax) should be taken according to package directions if there are no bowel movements after 48 hours. °7. Unless discharge instructions indicate otherwise, you may remove your bandages 48 hours after surgery and you may  shower at that time.  You may have steri-strips (small skin tapes) in place directly over the incision.  These strips should be left on the skin for 7-10 days and will come off on their own.  If your surgeon used skin glue on the incision, you may shower in 24 hours.  The glue will flake off over the next 2-3 weeks.  Any sutures or staples will be removed at the office during your follow-up visit. °8. ACTIVITIES:  You may resume regular daily activities (gradually increasing) beginning the next day.  Wearing a good support bra or sports bra minimizes pain and swelling.  You may have sexual intercourse when it is comfortable. °a. You may drive when you no longer are taking prescription pain medication, you can comfortably wear a seatbelt, and you can safely maneuver your car and apply brakes. °b. RETURN TO WORK:  ______________________________________________________________________________________ °9. You should see your doctor in the office for a follow-up appointment approximately two weeks after your surgery.  Your doctor’s nurse will typically make your follow-up appointment when she calls you with your pathology report.  Expect your pathology report 3-4 business days after your surgery.  You may call to check if you do not hear from us after three days. °10. OTHER INSTRUCTIONS: _______________________________________________________________________________________________ _____________________________________________________________________________________________________________________________________ °_____________________________________________________________________________________________________________________________________ °_____________________________________________________________________________________________________________________________________ ° °WHEN TO CALL DR WAKEFIELD: °1. Fever over 101.0 °2. Nausea and/or vomiting. °3. Extreme swelling or bruising. °4. Continued bleeding from  incision. °5. Increased pain, redness, or drainage from the incision. ° °The clinic staff is available to   answer your questions during regular business hours.  Please dont hesitate to call and ask to speak to one of the nurses for clinical concerns.  If you have a medical emergency, go to the nearest emergency room or call 911.  A surgeon from [facility] is always on call at the hospital.  For further questions, please visit [email or web address]   Post Anesthesia Home Care Instructions  Activity: Get plenty of rest for the remainder of the day. A responsible individual must stay with you for 24 hours following the procedure.  For the next 24 hours, DO NOT: -Drive a car -Paediatric nurse -Drink alcoholic beverages -Take any medication unless instructed by your physician -Make any legal decisions or sign important papers.  Meals: Start with liquid foods such as gelatin or soup. Progress to regular foods as tolerated. Avoid greasy, spicy, heavy foods. If nausea and/or vomiting occur, drink only clear liquids until the nausea and/or vomiting subsides. Call your physician if vomiting continues.  Special Instructions/Symptoms: Your throat may feel dry or sore from the anesthesia or the breathing tube placed in your throat during surgery. If this causes discomfort, gargle with warm salt water. The discomfort should disappear within 24 hours.  If you had a scopolamine patch placed behind your ear for the management of post- operative nausea and/or vomiting:  1. The medication in the patch is effective for 72 hours, after which it should be removed.  Wrap patch in a tissue and discard in the trash. Wash hands thoroughly with soap and water. 2. You may remove the patch earlier than 72 hours if you experience unpleasant side effects which may include dry mouth, dizziness or visual disturbances. 3. Avoid touching the patch. Wash your hands with soap and water after contact  with the patch.

## 2019-02-06 NOTE — Transfer of Care (Signed)
Immediate Anesthesia Transfer of Care Note  Patient: Kathy Griffin  Procedure(s) Performed: RE-EXCISION LEFT BREAST LUMPECTOMY (Left Breast)  Patient Location: PACU  Anesthesia Type:General  Level of Consciousness: sedated and responds to stimulation  Airway & Oxygen Therapy: Patient Spontanous Breathing and Patient connected to face mask oxygen  Post-op Assessment: Report given to RN, Post -op Vital signs reviewed and stable and Patient moving all extremities  Post vital signs: Reviewed and stable  Last Vitals:  Vitals Value Taken Time  BP 90/56 02/06/19 0815  Temp    Pulse 56 02/06/19 0815  Resp 13 02/06/19 0815  SpO2 100 % 02/06/19 0815  Vitals shown include unvalidated device data.  Last Pain:  Vitals:   02/06/19 0637  TempSrc: Oral  PainSc: 0-No pain      Patients Stated Pain Goal: 0 (74/93/55 2174)  Complications: No apparent anesthesia complications

## 2019-02-06 NOTE — Interval H&P Note (Signed)
History and Physical Interval Note:  02/06/2019 7:07 AM  Kathy Griffin  has presented today for surgery, with the diagnosis of BREAST CANCER POSS MEDIAL MARGIN.  The various methods of treatment have been discussed with the patient and family. After consideration of risks, benefits and other options for treatment, the patient has consented to  Procedure(s): RE-EXCISION LEFT BREAST LUMPECTOMY (Left) as a surgical intervention.  The patient's history has been reviewed, patient examined, no change in status, stable for surgery.  I have reviewed the patient's chart and labs.  Questions were answered to the patient's satisfaction.     Rolm Bookbinder

## 2019-02-07 ENCOUNTER — Encounter (HOSPITAL_BASED_OUTPATIENT_CLINIC_OR_DEPARTMENT_OTHER): Payer: Self-pay | Admitting: General Surgery

## 2019-02-08 NOTE — Anesthesia Postprocedure Evaluation (Signed)
Anesthesia Post Note  Patient: Kathy Griffin  Procedure(s) Performed: RE-EXCISION LEFT BREAST LUMPECTOMY (Left Breast)     Patient location during evaluation: PACU Anesthesia Type: General Level of consciousness: awake and alert Pain management: pain level controlled Vital Signs Assessment: post-procedure vital signs reviewed and stable Respiratory status: spontaneous breathing, nonlabored ventilation, respiratory function stable and patient connected to nasal cannula oxygen Cardiovascular status: blood pressure returned to baseline and stable Postop Assessment: no apparent nausea or vomiting Anesthetic complications: no    Last Vitals:  Vitals:   02/06/19 0845 02/06/19 0859  BP: 101/68 108/82  Pulse: (!) 55 (!) 56  Resp: 12 16  Temp:  36.5 C  SpO2: 100% 100%    Last Pain:  Vitals:   02/06/19 0859  TempSrc:   PainSc: 0-No pain   Pain Goal: Patients Stated Pain Goal: 0 (02/06/19 0637)                 Montez Hageman

## 2019-04-22 ENCOUNTER — Telehealth: Payer: Self-pay | Admitting: Hematology and Oncology

## 2019-04-22 NOTE — Telephone Encounter (Signed)
Returned patient's phone call regarding scheduling an appointment, port flush has been added per patient's request.

## 2019-04-23 ENCOUNTER — Inpatient Hospital Stay: Payer: 59 | Attending: Hematology and Oncology

## 2019-04-23 ENCOUNTER — Other Ambulatory Visit: Payer: Self-pay

## 2019-04-23 DIAGNOSIS — Z452 Encounter for adjustment and management of vascular access device: Secondary | ICD-10-CM | POA: Diagnosis not present

## 2019-04-23 DIAGNOSIS — C50212 Malignant neoplasm of upper-inner quadrant of left female breast: Secondary | ICD-10-CM | POA: Diagnosis present

## 2019-04-23 DIAGNOSIS — Z95828 Presence of other vascular implants and grafts: Secondary | ICD-10-CM

## 2019-04-23 MED ORDER — SODIUM CHLORIDE 0.9% FLUSH
10.0000 mL | Freq: Once | INTRAVENOUS | Status: AC
  Administered 2019-04-23: 10 mL via INTRAVENOUS
  Filled 2019-04-23: qty 10

## 2019-04-23 MED ORDER — HEPARIN SOD (PORK) LOCK FLUSH 100 UNIT/ML IV SOLN
500.0000 [IU] | Freq: Once | INTRAVENOUS | Status: AC
  Administered 2019-04-23: 14:00:00 500 [IU] via INTRAVENOUS
  Filled 2019-04-23: qty 5

## 2019-07-08 ENCOUNTER — Encounter: Payer: Self-pay | Admitting: *Deleted

## 2019-07-09 ENCOUNTER — Telehealth: Payer: Self-pay | Admitting: Hematology and Oncology

## 2019-07-09 NOTE — Telephone Encounter (Signed)
Returned patient's phone call regarding scheduling an appointment, left a voicemail. 

## 2019-08-08 ENCOUNTER — Other Ambulatory Visit: Payer: 59

## 2019-08-11 ENCOUNTER — Other Ambulatory Visit: Payer: Self-pay | Admitting: Internal Medicine

## 2019-08-11 ENCOUNTER — Other Ambulatory Visit: Payer: 59

## 2019-08-11 DIAGNOSIS — C50412 Malignant neoplasm of upper-outer quadrant of left female breast: Secondary | ICD-10-CM

## 2019-08-13 ENCOUNTER — Inpatient Hospital Stay: Payer: 59 | Attending: Hematology and Oncology

## 2019-08-13 ENCOUNTER — Other Ambulatory Visit: Payer: Self-pay

## 2019-08-13 DIAGNOSIS — Z923 Personal history of irradiation: Secondary | ICD-10-CM | POA: Insufficient documentation

## 2019-08-13 DIAGNOSIS — Z95828 Presence of other vascular implants and grafts: Secondary | ICD-10-CM | POA: Insufficient documentation

## 2019-08-13 DIAGNOSIS — Z452 Encounter for adjustment and management of vascular access device: Secondary | ICD-10-CM | POA: Diagnosis not present

## 2019-08-13 DIAGNOSIS — Z9221 Personal history of antineoplastic chemotherapy: Secondary | ICD-10-CM | POA: Diagnosis not present

## 2019-08-13 DIAGNOSIS — Z171 Estrogen receptor negative status [ER-]: Secondary | ICD-10-CM

## 2019-08-13 DIAGNOSIS — C50412 Malignant neoplasm of upper-outer quadrant of left female breast: Secondary | ICD-10-CM | POA: Insufficient documentation

## 2019-08-13 MED ORDER — HEPARIN SOD (PORK) LOCK FLUSH 100 UNIT/ML IV SOLN
500.0000 [IU] | Freq: Once | INTRAVENOUS | Status: AC
  Administered 2019-08-13: 500 [IU]
  Filled 2019-08-13: qty 5

## 2019-08-13 MED ORDER — SODIUM CHLORIDE 0.9% FLUSH
10.0000 mL | Freq: Once | INTRAVENOUS | Status: AC
  Administered 2019-08-13: 09:00:00 10 mL
  Filled 2019-08-13: qty 10

## 2019-08-14 ENCOUNTER — Other Ambulatory Visit: Payer: 59

## 2019-09-24 ENCOUNTER — Other Ambulatory Visit: Payer: 59

## 2019-10-07 ENCOUNTER — Ambulatory Visit
Admission: RE | Admit: 2019-10-07 | Discharge: 2019-10-07 | Disposition: A | Discharge status: Home / Self Care | Payer: 59 | Source: Ambulatory Visit | Attending: Internal Medicine | Admitting: Internal Medicine

## 2019-10-07 ENCOUNTER — Other Ambulatory Visit: Payer: Self-pay

## 2019-10-07 DIAGNOSIS — C50412 Malignant neoplasm of upper-outer quadrant of left female breast: Secondary | ICD-10-CM

## 2019-10-07 MED ORDER — GADOBUTROL 1 MMOL/ML IV SOLN
6.0000 mL | Freq: Once | INTRAVENOUS | Status: AC | PRN
  Administered 2019-10-07: 6 mL via INTRAVENOUS

## 2019-10-08 ENCOUNTER — Other Ambulatory Visit: Payer: Self-pay | Admitting: Internal Medicine

## 2019-10-08 DIAGNOSIS — C50212 Malignant neoplasm of upper-inner quadrant of left female breast: Secondary | ICD-10-CM

## 2019-10-22 ENCOUNTER — Ambulatory Visit
Admission: RE | Admit: 2019-10-22 | Discharge: 2019-10-22 | Disposition: A | Discharge status: Home / Self Care | Payer: 59 | Source: Ambulatory Visit | Attending: Internal Medicine | Admitting: Internal Medicine

## 2019-10-22 ENCOUNTER — Other Ambulatory Visit: Payer: Self-pay | Admitting: Internal Medicine

## 2019-10-22 ENCOUNTER — Other Ambulatory Visit: Payer: Self-pay

## 2019-10-22 DIAGNOSIS — C50212 Malignant neoplasm of upper-inner quadrant of left female breast: Secondary | ICD-10-CM

## 2019-10-29 ENCOUNTER — Telehealth: Payer: Self-pay | Admitting: *Deleted

## 2019-10-29 NOTE — Telephone Encounter (Signed)
Received office notes from Dr. Leonard Downing with [facility] stating pt is currently undergoing workup for cancer recurrence and suggested pt be seen by Dr. Lindi Adie. RN placed call to pt who states [facility] was a second opinion she was receiving and she is going to continue care with her oncologist located in [place] and does not need to schedule with Dr. Lindi Adie.

## 2019-10-30 ENCOUNTER — Ambulatory Visit
Admission: RE | Admit: 2019-10-30 | Discharge: 2019-10-30 | Disposition: A | Discharge status: Home / Self Care | Payer: 59 | Source: Ambulatory Visit | Attending: Internal Medicine | Admitting: Internal Medicine

## 2019-10-30 ENCOUNTER — Other Ambulatory Visit: Payer: Self-pay

## 2019-10-30 ENCOUNTER — Other Ambulatory Visit: Payer: Self-pay | Admitting: Internal Medicine

## 2019-10-30 DIAGNOSIS — N6320 Unspecified lump in the left breast, unspecified quadrant: Secondary | ICD-10-CM

## 2019-10-30 DIAGNOSIS — C50212 Malignant neoplasm of upper-inner quadrant of left female breast: Secondary | ICD-10-CM

## 2019-11-19 ENCOUNTER — Telehealth: Payer: Self-pay | Admitting: Hematology and Oncology

## 2019-11-19 NOTE — Telephone Encounter (Signed)
Called pt per 4/20 sch message - no answer . Left message for patient to call back if Port flush is still needed

## 2019-12-01 ENCOUNTER — Inpatient Hospital Stay: Payer: 59 | Attending: Hematology and Oncology

## 2019-12-01 ENCOUNTER — Other Ambulatory Visit: Payer: Self-pay

## 2019-12-01 DIAGNOSIS — Z171 Estrogen receptor negative status [ER-]: Secondary | ICD-10-CM | POA: Diagnosis not present

## 2019-12-01 DIAGNOSIS — C50412 Malignant neoplasm of upper-outer quadrant of left female breast: Secondary | ICD-10-CM | POA: Insufficient documentation

## 2019-12-01 DIAGNOSIS — Z452 Encounter for adjustment and management of vascular access device: Secondary | ICD-10-CM | POA: Diagnosis present

## 2019-12-01 DIAGNOSIS — Z95828 Presence of other vascular implants and grafts: Secondary | ICD-10-CM

## 2019-12-01 MED ORDER — SODIUM CHLORIDE 0.9% FLUSH
10.0000 mL | Freq: Once | INTRAVENOUS | Status: AC
  Administered 2019-12-01: 10 mL
  Filled 2019-12-01: qty 10

## 2019-12-01 MED ORDER — HEPARIN SOD (PORK) LOCK FLUSH 100 UNIT/ML IV SOLN
500.0000 [IU] | Freq: Once | INTRAVENOUS | Status: AC
  Administered 2019-12-01: 15:00:00 500 [IU]
  Filled 2019-12-01: qty 5

## 2019-12-12 ENCOUNTER — Other Ambulatory Visit (HOSPITAL_COMMUNITY): Payer: Self-pay | Admitting: Family Medicine

## 2019-12-12 ENCOUNTER — Other Ambulatory Visit: Payer: Self-pay

## 2019-12-12 ENCOUNTER — Other Ambulatory Visit: Payer: Self-pay | Admitting: Family Medicine

## 2019-12-12 DIAGNOSIS — C50912 Malignant neoplasm of unspecified site of left female breast: Secondary | ICD-10-CM

## 2019-12-24 ENCOUNTER — Encounter (HOSPITAL_COMMUNITY): Payer: Self-pay

## 2019-12-24 ENCOUNTER — Ambulatory Visit (HOSPITAL_COMMUNITY): Payer: 59

## 2020-05-19 ENCOUNTER — Other Ambulatory Visit (HOSPITAL_COMMUNITY): Payer: Self-pay

## 2020-05-19 ENCOUNTER — Other Ambulatory Visit: Payer: Self-pay

## 2020-05-19 DIAGNOSIS — R059 Cough, unspecified: Secondary | ICD-10-CM

## 2020-05-19 DIAGNOSIS — C50919 Malignant neoplasm of unspecified site of unspecified female breast: Secondary | ICD-10-CM

## 2020-05-26 ENCOUNTER — Inpatient Hospital Stay: Payer: BC Managed Care – PPO | Attending: Hematology and Oncology

## 2020-05-26 ENCOUNTER — Other Ambulatory Visit: Payer: Self-pay

## 2020-05-26 DIAGNOSIS — Z170 Estrogen receptor positive status [ER+]: Secondary | ICD-10-CM | POA: Insufficient documentation

## 2020-05-26 DIAGNOSIS — Z95828 Presence of other vascular implants and grafts: Secondary | ICD-10-CM

## 2020-05-26 DIAGNOSIS — Z452 Encounter for adjustment and management of vascular access device: Secondary | ICD-10-CM | POA: Insufficient documentation

## 2020-05-26 DIAGNOSIS — Z171 Estrogen receptor negative status [ER-]: Secondary | ICD-10-CM

## 2020-05-26 DIAGNOSIS — C50412 Malignant neoplasm of upper-outer quadrant of left female breast: Secondary | ICD-10-CM | POA: Diagnosis not present

## 2020-05-26 MED ORDER — HEPARIN SOD (PORK) LOCK FLUSH 100 UNIT/ML IV SOLN
500.0000 [IU] | Freq: Once | INTRAVENOUS | Status: AC
  Administered 2020-05-26: 500 [IU]
  Filled 2020-05-26: qty 5

## 2020-05-26 MED ORDER — SODIUM CHLORIDE 0.9% FLUSH
10.0000 mL | Freq: Once | INTRAVENOUS | Status: AC
  Administered 2020-05-26: 10 mL
  Filled 2020-05-26: qty 10

## 2020-05-26 NOTE — Patient Instructions (Signed)

## 2020-05-27 ENCOUNTER — Other Ambulatory Visit: Payer: Self-pay | Admitting: Radiation Oncology

## 2020-05-27 ENCOUNTER — Other Ambulatory Visit: Payer: Self-pay

## 2020-05-27 DIAGNOSIS — C50912 Malignant neoplasm of unspecified site of left female breast: Secondary | ICD-10-CM

## 2020-06-03 ENCOUNTER — Other Ambulatory Visit: Payer: Self-pay

## 2020-06-03 ENCOUNTER — Other Ambulatory Visit (HOSPITAL_BASED_OUTPATIENT_CLINIC_OR_DEPARTMENT_OTHER): Payer: 59

## 2020-06-03 ENCOUNTER — Encounter (HOSPITAL_BASED_OUTPATIENT_CLINIC_OR_DEPARTMENT_OTHER): Payer: Self-pay

## 2020-06-03 ENCOUNTER — Ambulatory Visit
Admission: RE | Admit: 2020-06-03 | Discharge: 2020-06-03 | Disposition: A | Discharge status: Home / Self Care | Payer: BC Managed Care – PPO | Source: Ambulatory Visit | Attending: Radiation Oncology | Admitting: Radiation Oncology

## 2020-06-03 DIAGNOSIS — R918 Other nonspecific abnormal finding of lung field: Secondary | ICD-10-CM | POA: Diagnosis not present

## 2020-06-03 DIAGNOSIS — R590 Localized enlarged lymph nodes: Secondary | ICD-10-CM | POA: Insufficient documentation

## 2020-06-03 DIAGNOSIS — C50912 Malignant neoplasm of unspecified site of left female breast: Secondary | ICD-10-CM | POA: Diagnosis not present

## 2020-06-03 LAB — GLUCOSE, CAPILLARY: Glucose-Capillary: 91 mg/dL (ref 70–99)

## 2020-06-03 MED ORDER — FLUDEOXYGLUCOSE F - 18 (FDG) INJECTION
6.0940 | Freq: Once | INTRAVENOUS | Status: AC | PRN
  Administered 2020-06-03: 6.094 via INTRAVENOUS

## 2020-06-08 ENCOUNTER — Ambulatory Visit (HOSPITAL_COMMUNITY): Payer: BC Managed Care – PPO

## 2020-07-21 ENCOUNTER — Institutional Professional Consult (permissible substitution): Payer: BC Managed Care – PPO | Admitting: Emergency Medicine

## 2020-09-28 DEATH — deceased

## 2020-11-10 IMAGING — US US PLC BREAST LOC DEV 1ST LESION  INC US GUIDE*L*
1 series · 3 of 3 positions shown · non-contrast
Comparison: Previous exam(s).

CLINICAL DATA: 62-year-old female for radioactive seed localization
of LEFT breast cancer and LEFT axillary lymph node metastasis prior
to lumpectomy and lymph node resection.

EXAM:
MAMMOGRAPHIC GUIDED RADIOACTIVE SEED LOCALIZATION OF THE LEFT BREAST
ULTRASOUND GUIDED RADIOACTIVE SEED LOCALIZATION OF THE LEFT AXILLA

[Series 1: us plc breast loc dev 1st lesion inc us guide*left · 0.07mm/px · 3 of 3 slices shown]
[im 1/3]
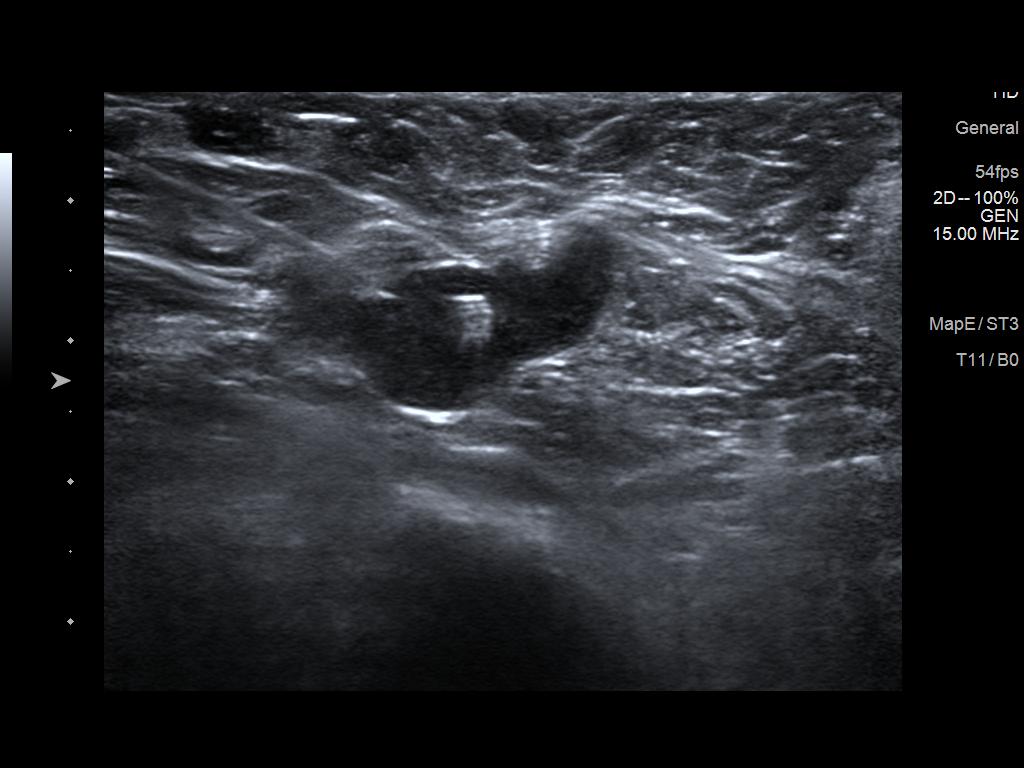
[im 2/3]
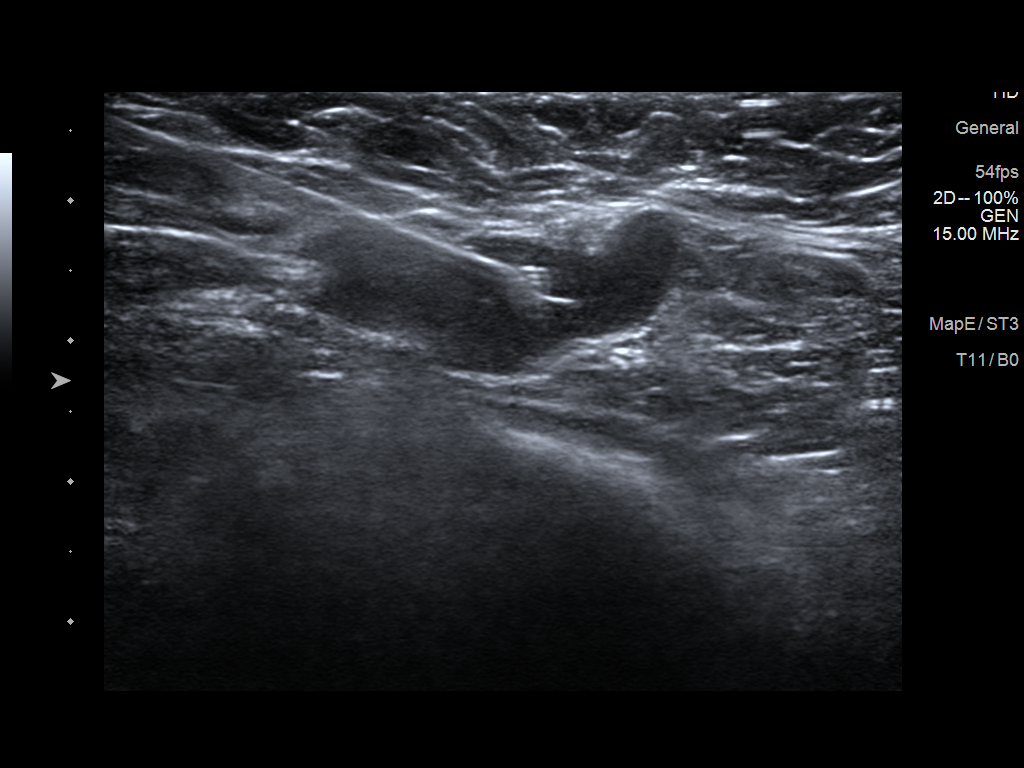
[im 3/3]
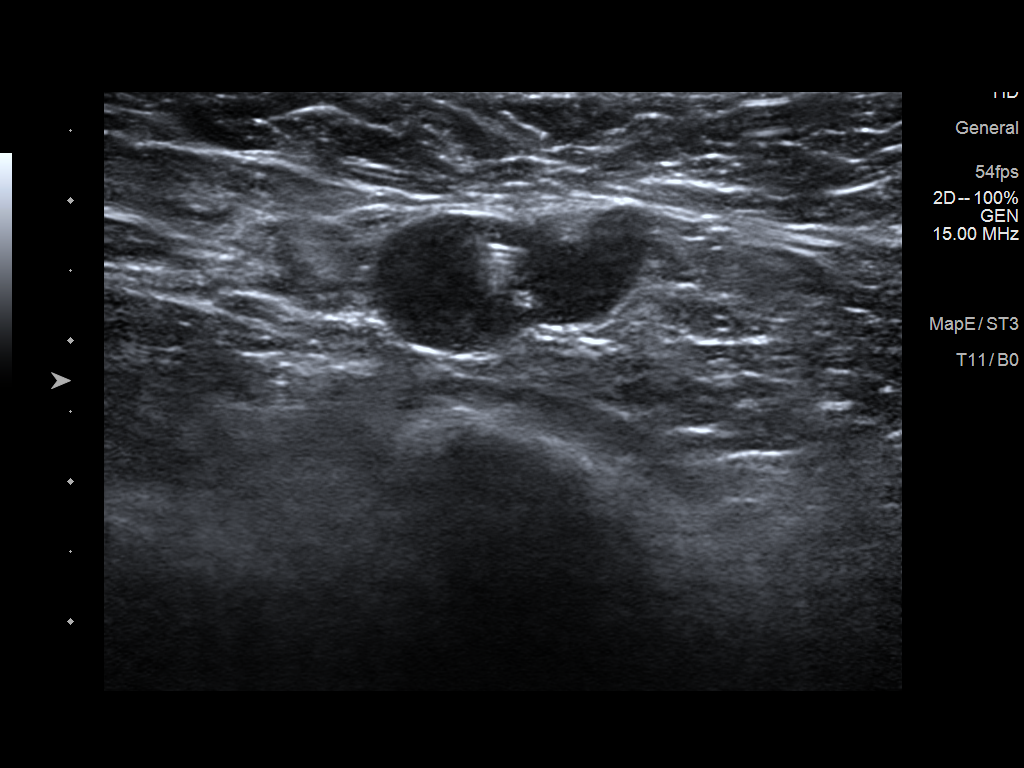

[3 of 3 positions shown; findings below may reference images not displayed]

FINDINGS: Patient presents for radioactive seed localizations prior to LEFT
lumpectomy and LEFT axillary lymph node resection. I met with the
patient and we discussed the procedure of seed localization
including benefits and alternatives. We discussed the high
likelihood of a successful procedure. We discussed the risks of the
procedure including infection, bleeding, tissue injury and further
surgery. We discussed the low dose of radioactivity involved in the
procedures. Informed, written consent was given.

The usual time-out protocol was performed immediately prior to the
procedures.

MAMMOGRAPHIC GUIDED LEFT BREAST SEED LOCALIZATION:

Using mammographic guidance, sterile technique, 1% lidocaine and an
K-L5I radioactive seed, the [place] biopsy clip was localized using a
SUPERIOR approach. The follow-up mammogram images confirm the seed
in the expected location and were marked for Dr. Sontay.

Follow-up survey of the patient confirms presence of the radioactive
seed.

Order number of [id number] seed:  [id number].

Total activity:  0.261 millicuries.  Reference Date: 12/12/2018.

ULTRASOUND-GUIDED LEFT AXILLARY LYMPH NODE SEED LOCALIZATION:

Using ultrasound guidance, sterile technique, 1% lidocaine and an
K-L5I radioactive seed, the enlarged LEFT axillary lymph node
containing biopsy clip was localized using a MEDIAL approach. The
follow-up mammogram images confirm the seed in the expected location
and were marked for Dr. Sontay.

Follow-up survey of the patient confirms presence of the radioactive
seed.

Order number of [id number] seed:  [id number].

Total activity:  0.261 millicuries.  Reference Date: 12/12/2018.

The patient tolerated the procedures well and was released from the
[REDACTED]. She was given instructions regarding seed removal.
IMPRESSION: Radioactive seed localization LEFT breast and LEFT axilla. No
apparent complications.

## 2020-11-10 IMAGING — MG NEEDLE LOCALIZATION OF THE LEFT BREAST WITH MAMMO GUIDANCE
8 of 10 series · 8 of 10 positions shown · non-contrast
Comparison: Previous exam(s).

CLINICAL DATA: 62-year-old female for radioactive seed localization
of LEFT breast cancer and LEFT axillary lymph node metastasis prior
to lumpectomy and lymph node resection.

EXAM:
MAMMOGRAPHIC GUIDED RADIOACTIVE SEED LOCALIZATION OF THE LEFT BREAST
ULTRASOUND GUIDED RADIOACTIVE SEED LOCALIZATION OF THE LEFT AXILLA

[L CC (1 of 5)]
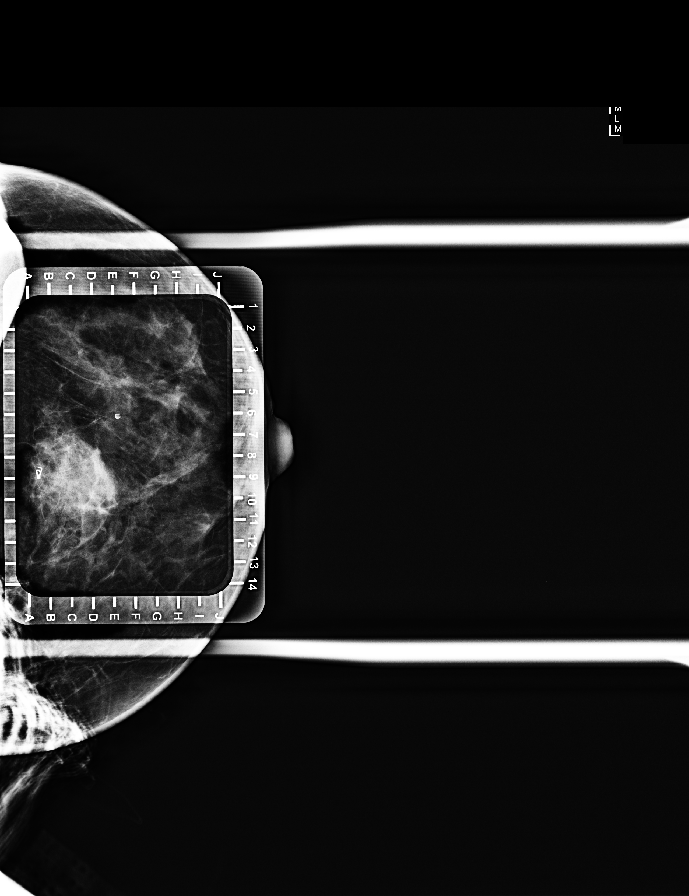

[L CC (2 of 5)]
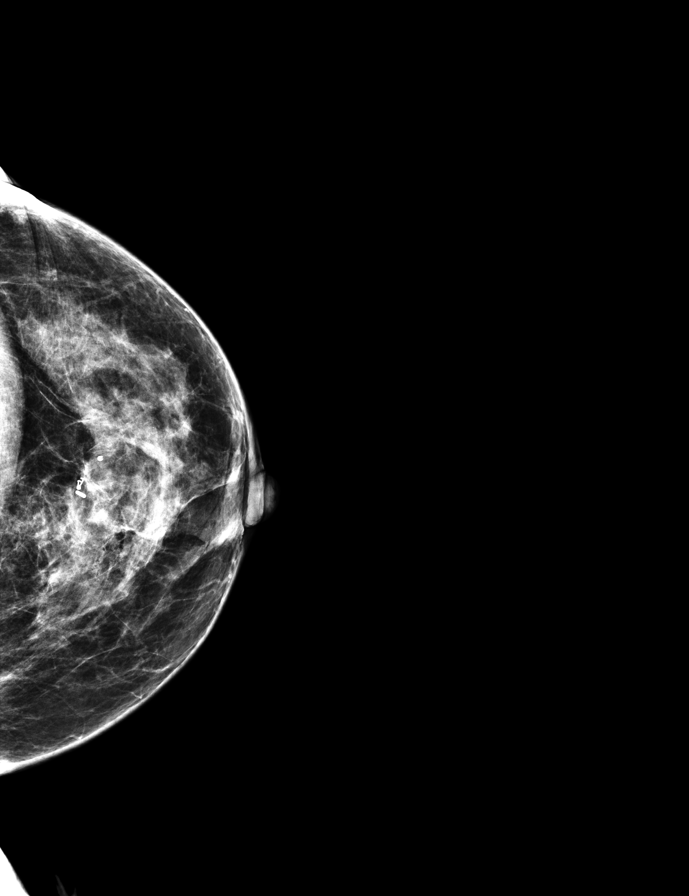

[L ML (1 of 2)]
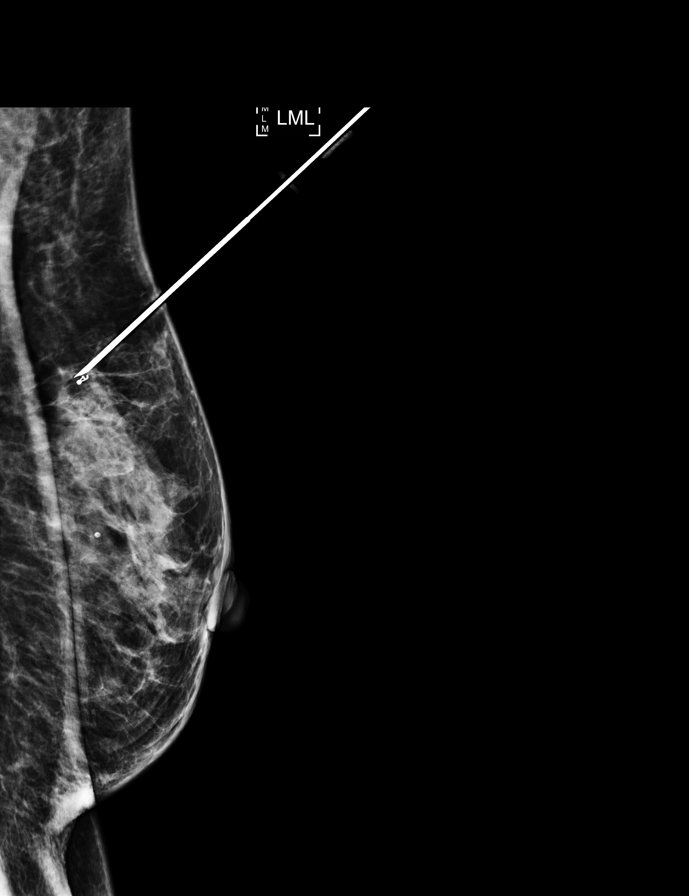

[L CC (3 of 5)]
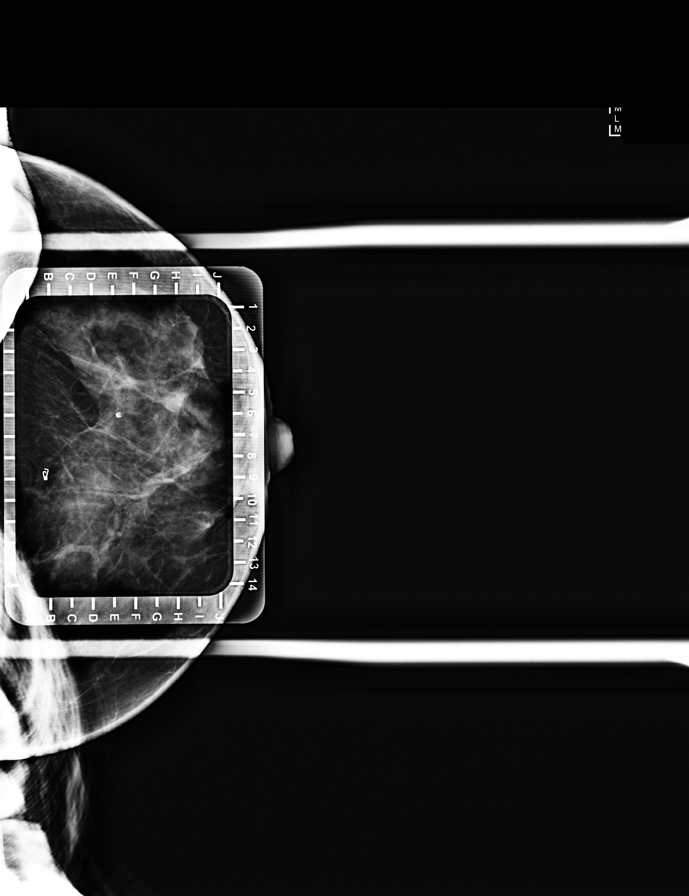

[L MLO]
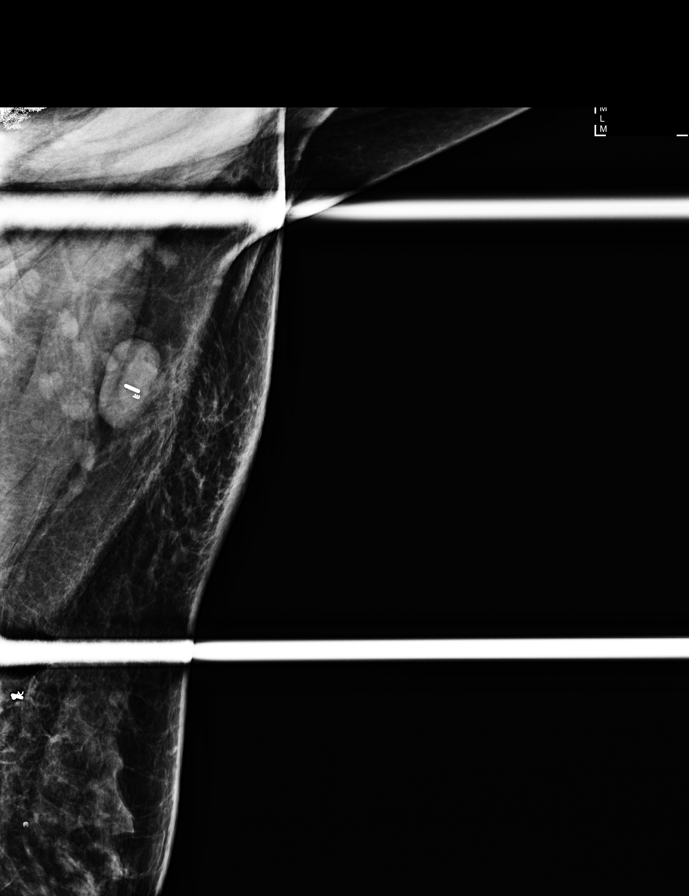

[L ML (2 of 2)]
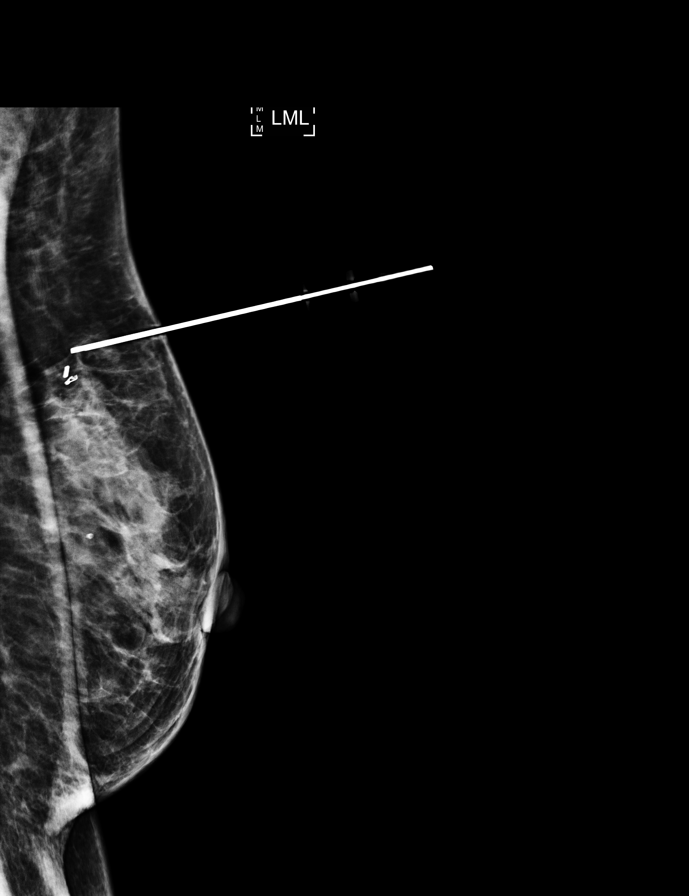

[L CC (4 of 5)]
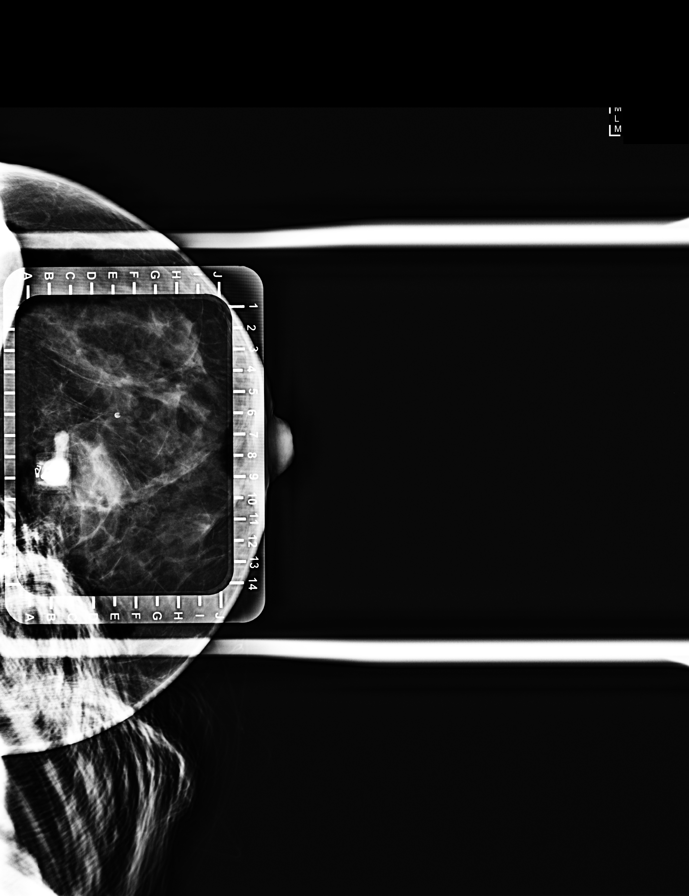

[L CC (5 of 5)]
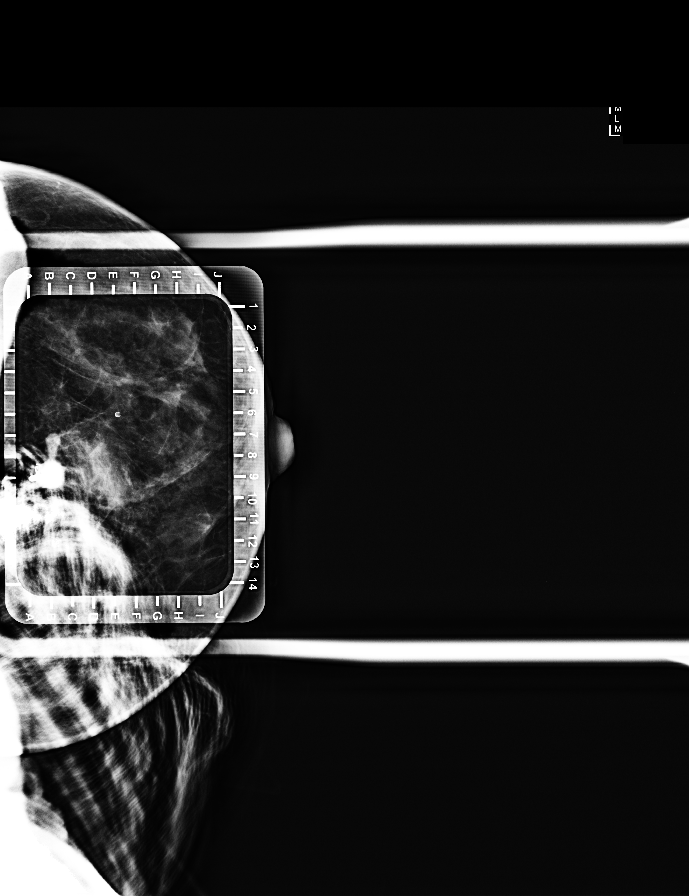

[8 of 10 positions shown; findings below may reference images not displayed]

FINDINGS: Patient presents for radioactive seed localizations prior to LEFT
lumpectomy and LEFT axillary lymph node resection. I met with the
patient and we discussed the procedure of seed localization
including benefits and alternatives. We discussed the high
likelihood of a successful procedure. We discussed the risks of the
procedure including infection, bleeding, tissue injury and further
surgery. We discussed the low dose of radioactivity involved in the
procedures. Informed, written consent was given.

The usual time-out protocol was performed immediately prior to the
procedures.

MAMMOGRAPHIC GUIDED LEFT BREAST SEED LOCALIZATION:

Using mammographic guidance, sterile technique, 1% lidocaine and an
K-L5I radioactive seed, the [place] biopsy clip was localized using a
SUPERIOR approach. The follow-up mammogram images confirm the seed
in the expected location and were marked for Dr. Sontay.

Follow-up survey of the patient confirms presence of the radioactive
seed.

Order number of [id number] seed:  [id number].

Total activity:  0.261 millicuries.  Reference Date: 12/12/2018.

ULTRASOUND-GUIDED LEFT AXILLARY LYMPH NODE SEED LOCALIZATION:

Using ultrasound guidance, sterile technique, 1% lidocaine and an
K-L5I radioactive seed, the enlarged LEFT axillary lymph node
containing biopsy clip was localized using a MEDIAL approach. The
follow-up mammogram images confirm the seed in the expected location
and were marked for Dr. Sontay.

Follow-up survey of the patient confirms presence of the radioactive
seed.

Order number of [id number] seed:  [id number].

Total activity:  0.261 millicuries.  Reference Date: 12/12/2018.

The patient tolerated the procedures well and was released from the
[REDACTED]. She was given instructions regarding seed removal.
IMPRESSION: Radioactive seed localization LEFT breast and LEFT axilla. No
apparent complications.

## 2020-11-11 IMAGING — DX MM BREAST SURGICAL SPECIMEN
1 series · 2 of 2 positions shown · non-contrast
Comparison: Previous exam(s).

CLINICAL DATA: 63-year-old female for evaluation of LEFT breast and
LEFT axillary surgical specimens.

EXAM:
SPECIMEN RADIOGRAPH OF THE LEFT BREAST
SPECIMEN RADIOGRAPH OF THE LEFT AXILLA

[Series 2: specimen digital x-ray, derived · left · 2 of 2 slices shown]
[im 1/2]
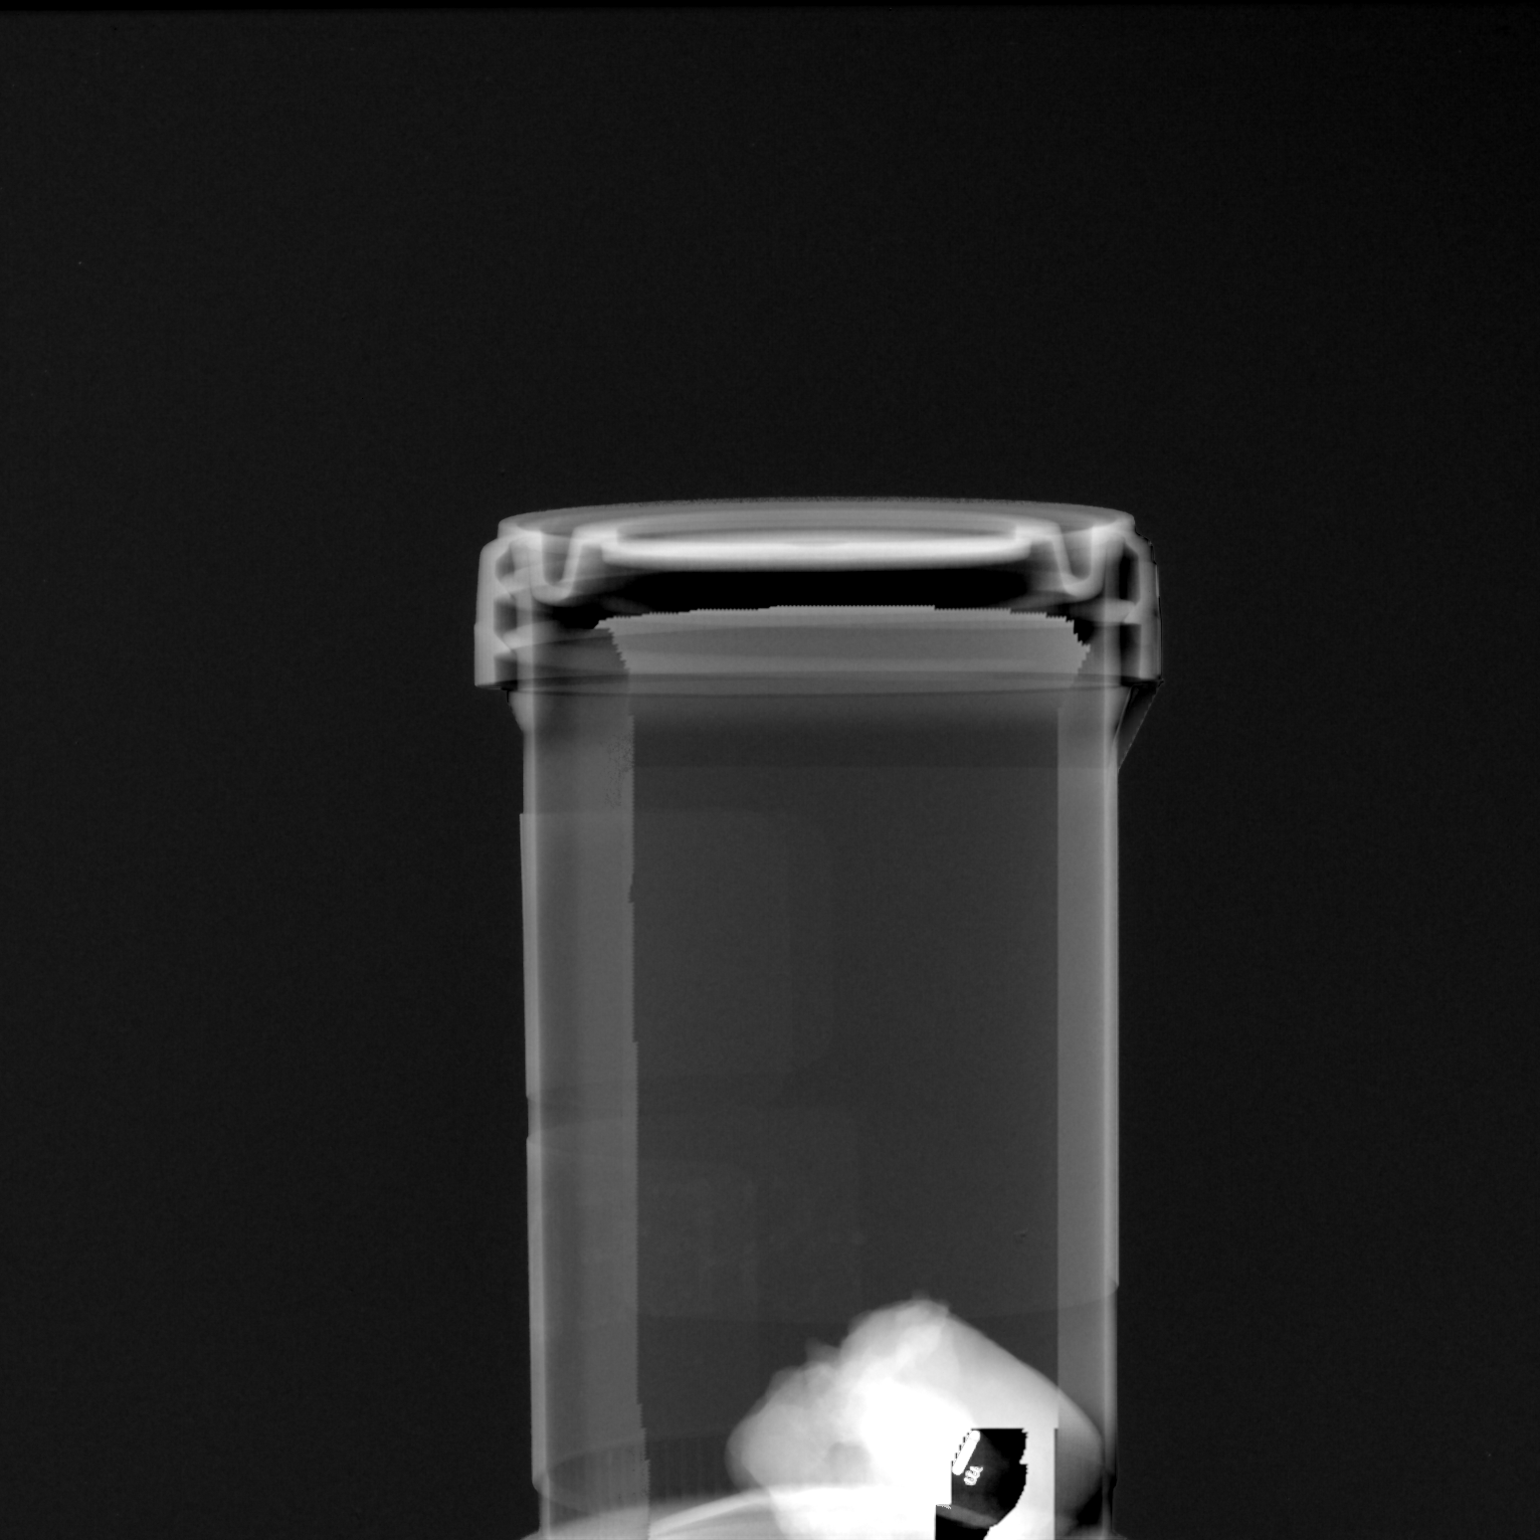
[im 2/2]
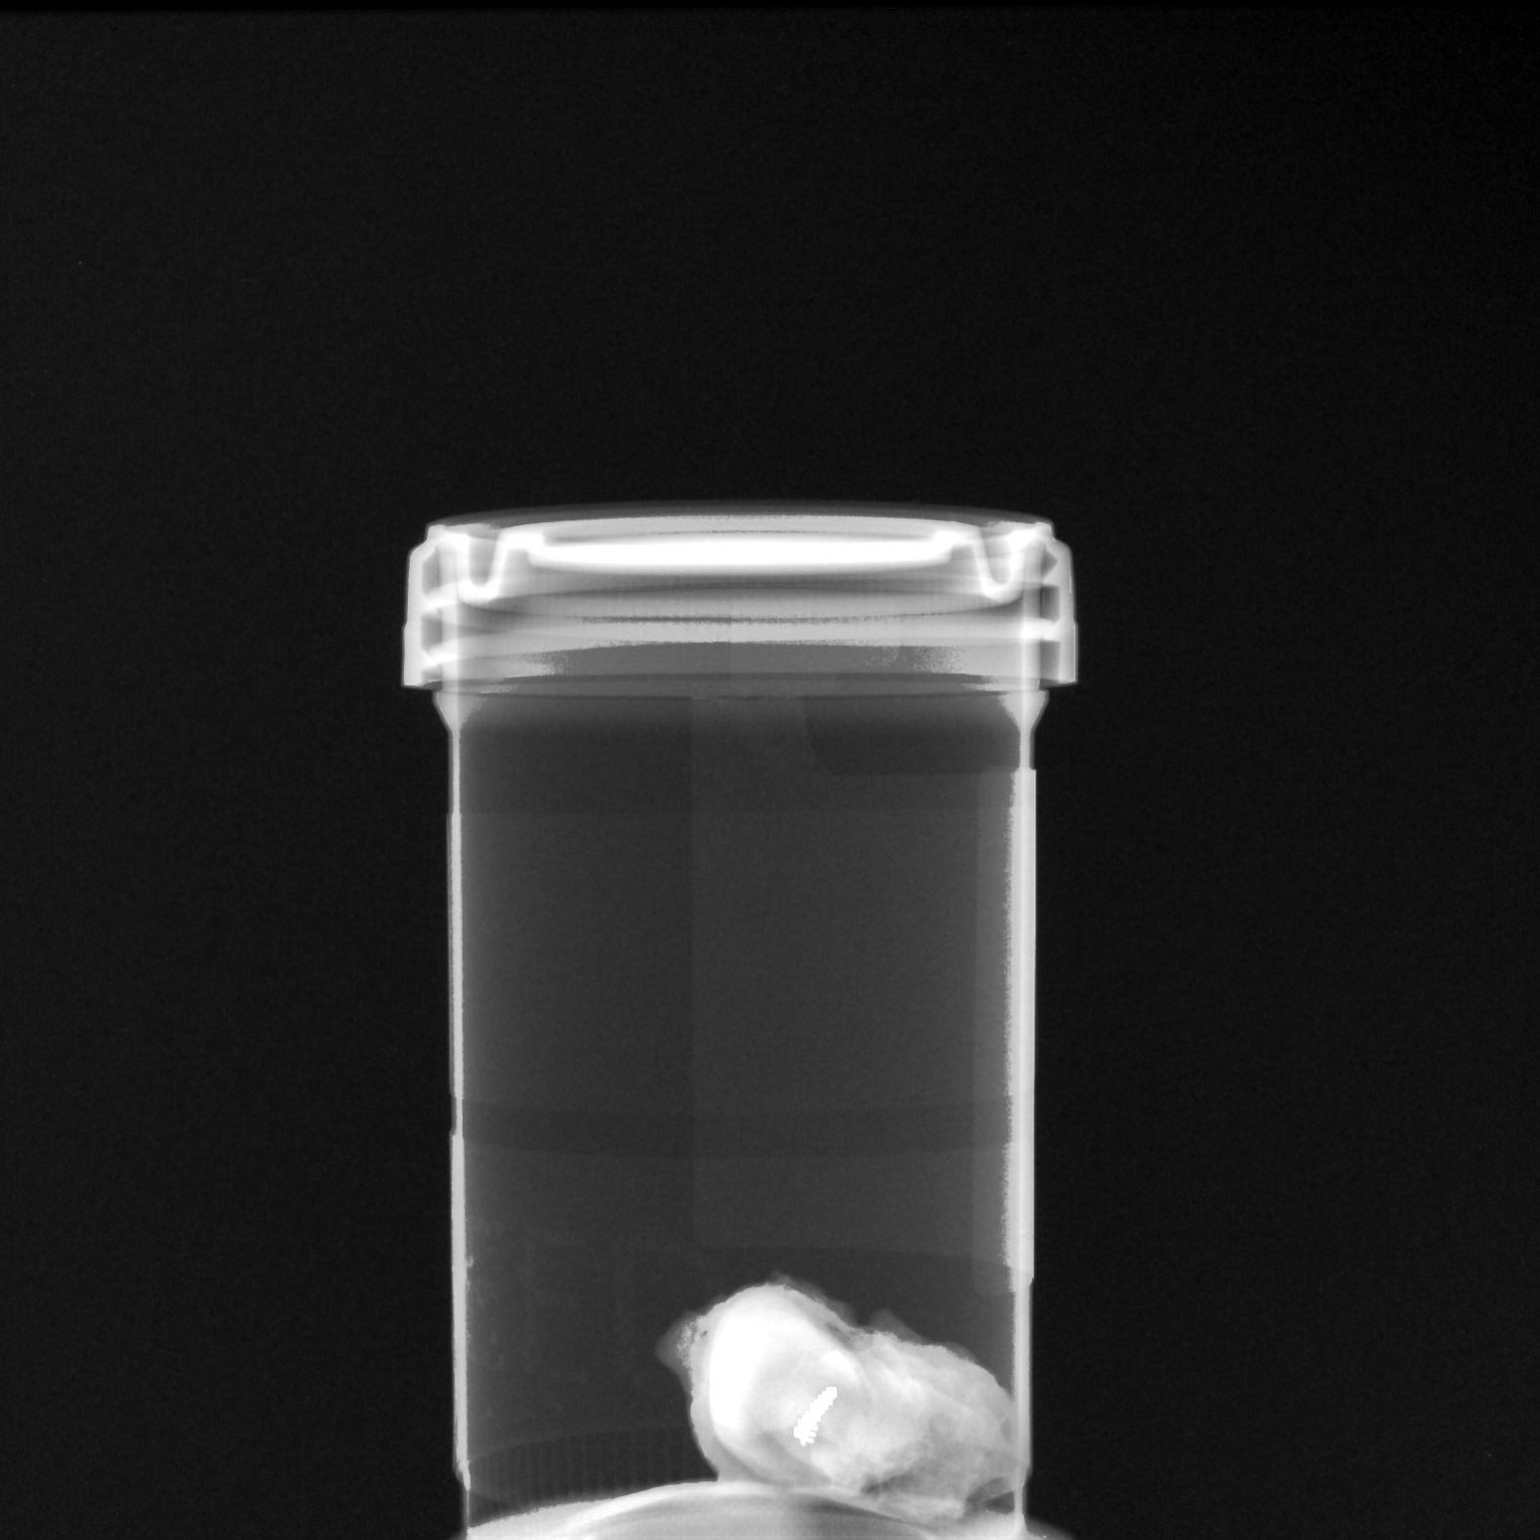

[2 of 2 positions shown; findings below may reference images not displayed]

FINDINGS: Status post excision of the LEFT breast and LEFT axilla.

The LEFT breast radioactive seed and biopsy marker clip are present
and completely intact. The seed is in a separate container from the
specimen and biopsy clip.

The LEFT axillary radioactive seed and biopsy marker clip are
present and completely intact.
IMPRESSION: Specimen radiograph of the LEFT breast and LEFT axilla.
